# Patient Record
Sex: Female | Born: 1995 | Hispanic: No | Marital: Single | Smoking: Never smoker
Health system: Southern US, Community
[De-identification: ages and names within clinical notes are randomized; demographics above are authoritative.]

## PROBLEM LIST (undated history)

## (undated) DIAGNOSIS — N760 Acute vaginitis: Secondary | ICD-10-CM

## (undated) DIAGNOSIS — E669 Obesity, unspecified: Secondary | ICD-10-CM

## (undated) DIAGNOSIS — B9689 Other specified bacterial agents as the cause of diseases classified elsewhere: Secondary | ICD-10-CM

## (undated) DIAGNOSIS — F988 Other specified behavioral and emotional disorders with onset usually occurring in childhood and adolescence: Secondary | ICD-10-CM

## (undated) HISTORY — PX: NO PAST SURGERIES: SHX2092

---
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
## 2017-05-15 ENCOUNTER — Other Ambulatory Visit: Payer: Self-pay

## 2017-05-15 ENCOUNTER — Ambulatory Visit
Admission: EM | Admit: 2017-05-15 | Discharge: 2017-05-15 | Disposition: A | Discharge status: Home / Self Care | Payer: BLUE CROSS/BLUE SHIELD | Attending: Family Medicine | Admitting: Family Medicine

## 2017-05-15 ENCOUNTER — Encounter: Payer: Self-pay | Admitting: *Deleted

## 2017-05-15 DIAGNOSIS — B349 Viral infection, unspecified: Secondary | ICD-10-CM | POA: Diagnosis not present

## 2017-05-15 DIAGNOSIS — J069 Acute upper respiratory infection, unspecified: Secondary | ICD-10-CM

## 2017-05-15 LAB — RAPID STREP SCREEN (MED CTR MEBANE ONLY): Streptococcus, Group A Screen (Direct): NEGATIVE

## 2017-05-15 MED ORDER — FLUTICASONE PROPIONATE 50 MCG/ACT NA SUSP: 2.0000 | Freq: Every day | NASAL | 0 refills | Status: DC

## 2017-05-15 NOTE — ED Triage Notes (Signed)
PAtient started having symptoms of nasal congestion and sore throat  2 days ago.

## 2017-05-15 NOTE — ED Provider Notes (Signed)
[facility]   CSN: [id number] Arrival date & time: 05/15/17  1309  History   Chief Complaint Chief Complaint  Patient presents with  . Nasal Congestion  . Sore Throat   HPI  22 year old female presents with nasal congestion and sore throat.  Patient states she is been sick for 2 days.  She has had sore throat, and congestion.  Pain is 8/10 in severity. She states she had a fever last night.  No fever today.  No known exacerbating relieving factors.  No medications or interventions tried.  No other associated symptoms.  No other complaints or concerns at this time.  Past Medical History:  Diagnosis Date  . ADD (attention deficit disorder)    Patient Active Problem List   Diagnosis Date Noted  . Laceration of left index finger without damage to nail 08/25/2016  . Need for prophylactic vaccination with combined diphtheria-tetanus-pertussis (DTP) vaccine 08/25/2016   Past Surgical History:  Procedure Laterality Date  . NO PAST SURGERIES      OB History    No data available     Home Medications    Prior to Admission medications   Medication Sig Start Date End Date Taking? Authorizing Provider  fluticasone (FLONASE) 50 MCG/ACT nasal spray Place 2 sprays into both nostrils daily. 05/15/17   Tommie Sams, DO   Family History Family History  Problem Relation Age of Onset  . Healthy Mother    Social History Social History   Tobacco Use  . Smoking status: Never Smoker  . Smokeless tobacco: Never Used  Substance Use Topics  . Alcohol use: No  . Drug use: No     Allergies   Patient has no known allergies.   Review of Systems Review of Systems  Constitutional: Positive for fever.  HENT: Positive for congestion and sore throat.    Physical Exam Triage Vital Signs ED Triage Vitals  Enc Vitals Group     BP 05/15/17 1335 118/69     Pulse Rate 05/15/17 1335 (!) 110     Resp 05/15/17 1335 18     Temp 05/15/17 1335 98.4 F (36.9 C)     Temp Source  05/15/17 1335 Oral     SpO2 05/15/17 1335 98 %     Weight 05/15/17 1336 284 lb (128.8 kg)     Height 05/15/17 1336 5\' 6"  (1.676 m)     Head Circumference --      Peak Flow --      Pain Score 05/15/17 1335 8     Pain Loc --      Pain Edu? --      Excl. in GC? --    Updated Vital Signs BP 118/69 (BP Location: Left Arm)   Pulse (!) 110   Temp 98.4 F (36.9 C) (Oral)   Resp 18   Ht 5\' 6"  (1.676 m)   Wt 284 lb (128.8 kg)   LMP 04/24/2017   SpO2 98%   BMI 45.84 kg/m     Physical Exam  Constitutional: She is oriented to person, place, and time. She appears well-developed. No distress.  HENT:  Head: Normocephalic and atraumatic.  Oropharynx with erythema.  Cobblestoning noted.  Eyes: Conjunctivae are normal. Right eye exhibits no discharge. Left eye exhibits no discharge.  Neck: Neck supple.  Cardiovascular: Regular rhythm.  Tachycardia.  Pulmonary/Chest: Effort normal and breath sounds normal. She has no wheezes. She has no rales.  Lymphadenopathy:    She has no cervical adenopathy.  Neurological: She is alert and oriented to person, place, and time.  Nursing note and vitals reviewed.  UC Treatments / Results  Labs (all labs ordered are listed, but only abnormal results are displayed) Labs Reviewed  RAPID STREP SCREEN (NOT [facility])  CULTURE, GROUP A STREP [facility])    EKG  EKG Interpretation None       Radiology No results found.  Procedures Procedures (including critical care time)  Medications Ordered in UC Medications - No data to display   Initial Impression / Assessment and Plan / UC Course  I have reviewed the triage vital signs and the nursing notes.  Pertinent labs & imaging results that were available during my care of the patient were reviewed by me and considered in my medical decision making (see chart for details).     22 year old female presents with a viral upper respiratory infection.  No indication for antibiotics.  Treating with Flonase.   Supportive care.  Final Clinical Impressions(s) / UC Diagnoses   Final diagnoses:  Viral upper respiratory tract infection    ED Discharge Orders        Ordered    fluticasone (FLONASE) 50 MCG/ACT nasal spray  Daily     05/15/17 1409     Controlled Substance Prescriptions [place] Controlled Substance Registry consulted? Not Applicable   Tommie SamsCook, Reganne Messerschmidt G, DO 05/15/17 1447

## 2017-05-15 NOTE — Discharge Instructions (Signed)
Rest, fluids.  Flonase as directed.  Take care  Dr. Adriana Simasook

## 2017-05-16 ENCOUNTER — Telehealth: Payer: Self-pay | Admitting: Emergency Medicine

## 2017-05-16 LAB — CULTURE, GROUP A STREP (THRC)

## 2017-05-16 MED ORDER — AMOXICILLIN 500 MG PO CAPS: 500.0000 mg | ORAL_CAPSULE | Freq: Two times a day (BID) | ORAL | 0 refills | Status: AC

## 2017-05-16 NOTE — Telephone Encounter (Signed)
Rx sent in (see result note from throat culture done 05/15/17).

## 2017-11-09 ENCOUNTER — Ambulatory Visit
Admission: EM | Admit: 2017-11-09 | Discharge: 2017-11-09 | Disposition: A | Discharge status: Home / Self Care | Payer: BLUE CROSS/BLUE SHIELD | Attending: Family Medicine | Admitting: Family Medicine

## 2017-11-09 ENCOUNTER — Other Ambulatory Visit: Payer: Self-pay

## 2017-11-09 DIAGNOSIS — R509 Fever, unspecified: Secondary | ICD-10-CM | POA: Diagnosis not present

## 2017-11-09 DIAGNOSIS — J029 Acute pharyngitis, unspecified: Secondary | ICD-10-CM | POA: Diagnosis not present

## 2017-11-09 DIAGNOSIS — R52 Pain, unspecified: Secondary | ICD-10-CM

## 2017-11-09 DIAGNOSIS — M791 Myalgia, unspecified site: Secondary | ICD-10-CM

## 2017-11-09 DIAGNOSIS — R5383 Other fatigue: Secondary | ICD-10-CM

## 2017-11-09 LAB — RAPID STREP SCREEN (MED CTR MEBANE ONLY): Streptococcus, Group A Screen (Direct): NEGATIVE

## 2017-11-09 MED ORDER — AMOXICILLIN 500 MG PO CAPS: 500.0000 mg | ORAL_CAPSULE | Freq: Two times a day (BID) | ORAL | 0 refills | Status: DC

## 2017-11-09 MED ORDER — PREDNISONE 10 MG PO TABS: 20.0000 mg | ORAL_TABLET | Freq: Every day | ORAL | 0 refills | Status: DC

## 2017-11-09 NOTE — ED Triage Notes (Signed)
Pt with sore throat and headache starting on Friday. Pain 7/10

## 2017-11-09 NOTE — Discharge Instructions (Addendum)
Your rapid strep test is negative today. Strep culture sent to lab. Will treat for suspected strep throat while awaiting culture based on your presentation.Begin amoxicillin and prednisone (for tonsil swelling). Increase rest and fluids. Take Motrin for fever and body aches. Symptoms should begin to resolve with antibiotics over the next 48-72 hours. 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 F/u with PCP or our clinic if condition worsens or does not improve with treatment plan.

## 2017-11-09 NOTE — ED Provider Notes (Signed)
[facility]    CSN: [id number] Arrival date & time: 11/09/17  1537     History   Chief Complaint Chief Complaint  Patient presents with  . Sore Throat    HPI Kristen Charles is a 22 y.o. female.  Patient presents with severe sore throat x 2 days. Admits to fevers up to 101 degrees last night. Admits to associated body aches and fatigue. Admits painful swallowing and enlarged and tender lymph nodes of the anterior neck. Denies cough, congestion, ear pain. Patient has not taken any medication for symptoms. 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 Patient denies any further complaints or concerns today.  HPI  Past Medical History:  Diagnosis Date  . ADD (attention deficit disorder)     Patient Active Problem List   Diagnosis Date Noted  . Laceration of left index finger without damage to nail 08/25/2016  . Need for prophylactic vaccination with combined diphtheria-tetanus-pertussis (DTP) vaccine 08/25/2016    Past Surgical History:  Procedure Laterality Date  . NO PAST SURGERIES      OB History   None      Home Medications    Prior to Admission medications   Medication Sig Start Date End Date Taking? Authorizing Provider  fluticasone (FLONASE) 50 MCG/ACT nasal spray Place 2 sprays into both nostrils daily. 05/15/17   Tommie Samsook, Jayce G, DO    Family History Family History  Problem Relation Age of Onset  . Healthy Mother     Social History Social History   Tobacco Use  . Smoking status: Never Smoker  . Smokeless tobacco: Never Used  Substance Use Topics  . Alcohol use: No  . Drug use: No     Allergies   Patient has no known allergies.   Review of Systems Review of Systems  Constitutional: Positive for appetite change, chills, fatigue and fever.  HENT: Positive for sore throat, trouble swallowing and voice change. Negative for congestion, ear discharge, ear pain, rhinorrhea, sinus pressure and sinus pain.    Eyes: Negative for discharge and redness.  Respiratory: Negative for cough and shortness of breath.   Cardiovascular: Negative for chest pain.  Gastrointestinal: Positive for nausea. Negative for abdominal pain and vomiting.  Musculoskeletal: Positive for myalgias.  Skin: Negative for rash.  Neurological: Negative for dizziness and weakness.  Hematological: Positive for adenopathy (enlarged lymph nodes of neck).     Physical Exam Triage Vital Signs ED Triage Vitals  Enc Vitals Group     BP 11/09/17 1546 114/70     Pulse Rate 11/09/17 1546 96     Resp 11/09/17 1546 20     Temp 11/09/17 1546 98.1 F (36.7 C)     Temp Source 11/09/17 1546 Oral     SpO2 11/09/17 1546 100 %     Weight 11/09/17 1547 276 lb 2 oz (125.2 kg)     Height 11/09/17 1547 5\' 6"  (1.676 m)     Head Circumference --      Peak Flow --      Pain Score 11/09/17 1547 7     Pain Loc --      Pain Edu? --      Excl. in GC? --    No data found.  Updated Vital Signs BP 114/70 (BP Location: Left Arm)   Pulse 96   Temp 98.1 F (36.7 C) (Oral)   Resp 20   Ht 5\' 6"  (1.676 m)  Wt 276 lb 2 oz (125.2 kg)   LMP 10/14/2017   SpO2 100%   BMI 44.57 kg/m   Visual Acuity Right Eye Distance:   Left Eye Distance:   Bilateral Distance:    Right Eye Near:   Left Eye Near:    Bilateral Near:     Physical Exam  Constitutional: She is oriented to person, place, and time. She appears well-developed and well-nourished.  HENT:  Head: Normocephalic and atraumatic.  Right Ear: Hearing, tympanic membrane, external ear and ear canal normal.  Left Ear: Hearing, tympanic membrane, external ear and ear canal normal.  Nose: Nose normal.  Mouth/Throat: Uvula is midline and mucous membranes are normal. Oropharyngeal exudate and posterior oropharyngeal erythema present. Tonsils are 1+ on the right. Tonsils are 2+ on the left. Tonsillar exudate (left tonsil with whitish exudates).  Eyes: Pupils are equal, round, and reactive to  light. Conjunctivae and EOM are normal.  Neck: Neck supple.  Cardiovascular: Normal rate, regular rhythm and normal heart sounds.  No murmur heard. Pulmonary/Chest: Effort normal and breath sounds normal. No respiratory distress. She has no wheezes.  Lymphadenopathy:    She has cervical adenopathy (anterior L>R).  Neurological: She is alert and oriented to person, place, and time.  Skin: Skin is warm and dry. No rash noted. No erythema.  Psychiatric: She has a normal mood and affect. Her behavior is normal.     UC Treatments / Results  Labs (all labs ordered are listed, but only abnormal results are displayed) Labs Reviewed  RAPID STREP SCREEN ([facility] ONLY)    EKG None  Radiology No results found.  Procedures Procedures (including critical care time)  Medications Ordered in UC Medications - No data to display  Initial Impression / Assessment and Plan / UC Course  I have reviewed the triage vital signs and the nursing notes.  Pertinent labs & imaging results that were available during my care of the patient were reviewed by me and considered in my medical decision making (see chart for details).   Patient with severe sore throat and fever. On exam, enlarged left tonsil with whitish exudates. RST is negative. Strep culture sent to lab. Will treat for suspected strep throat while awaiting culture based on her presentation.Begin amoxicillin and prednisone (for tonsil swelling). Increase rest and fluids. Take Motrin for fever and body aches. Symptoms should begin to resolve with antibiotics over the next 48-72 hours. 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 F/u with PCP or our clinic if condition worsens or does not improve with treatment plan.  Final Clinical Impressions(s) / UC Diagnoses   Final diagnoses:  None   Discharge Instructions   None    ED Prescriptions    None     Controlled Substance  Prescriptions [place] Controlled Substance Registry consulted? Not Applicable   Gareth Morgan 11/09/17 1614

## 2017-11-11 ENCOUNTER — Ambulatory Visit (INDEPENDENT_AMBULATORY_CARE_PROVIDER_SITE_OTHER): Payer: BLUE CROSS/BLUE SHIELD

## 2017-11-11 ENCOUNTER — Other Ambulatory Visit: Payer: Self-pay

## 2017-11-11 ENCOUNTER — Ambulatory Visit
Admission: EM | Admit: 2017-11-11 | Discharge: 2017-11-11 | Disposition: A | Discharge status: Home / Self Care | Payer: BLUE CROSS/BLUE SHIELD | Attending: Family Medicine | Admitting: Family Medicine

## 2017-11-11 ENCOUNTER — Encounter: Payer: Self-pay | Admitting: Emergency Medicine

## 2017-11-11 DIAGNOSIS — M791 Myalgia, unspecified site: Secondary | ICD-10-CM

## 2017-11-11 DIAGNOSIS — R0789 Other chest pain: Secondary | ICD-10-CM

## 2017-11-11 DIAGNOSIS — M5489 Other dorsalgia: Secondary | ICD-10-CM | POA: Diagnosis not present

## 2017-11-11 MED ORDER — METAXALONE 800 MG PO TABS: 800.0000 mg | ORAL_TABLET | Freq: Three times a day (TID) | ORAL | 0 refills | Status: DC | PRN

## 2017-11-11 MED ORDER — MELOXICAM 15 MG PO TABS: 15.0000 mg | ORAL_TABLET | Freq: Every day | ORAL | 0 refills | Status: DC | PRN
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
## 2017-11-12 LAB — CULTURE, GROUP A STREP (THRC)

## 2017-12-03 ENCOUNTER — Other Ambulatory Visit: Payer: Self-pay

## 2017-12-03 ENCOUNTER — Ambulatory Visit
Admission: EM | Admit: 2017-12-03 | Discharge: 2017-12-03 | Disposition: A | Discharge status: Home / Self Care | Payer: BLUE CROSS/BLUE SHIELD | Attending: Family Medicine | Admitting: Family Medicine

## 2017-12-03 ENCOUNTER — Encounter: Payer: Self-pay | Admitting: Emergency Medicine

## 2017-12-03 DIAGNOSIS — R350 Frequency of micturition: Secondary | ICD-10-CM | POA: Diagnosis not present

## 2017-12-03 DIAGNOSIS — R300 Dysuria: Secondary | ICD-10-CM

## 2017-12-03 LAB — URINALYSIS, COMPLETE (UACMP) WITH MICROSCOPIC
Bilirubin Urine: NEGATIVE
GLUCOSE, UA: NEGATIVE mg/dL
HGB URINE DIPSTICK: NEGATIVE
Ketones, ur: NEGATIVE mg/dL
Nitrite: NEGATIVE
PROTEIN: NEGATIVE mg/dL
RBC / HPF: NONE SEEN RBC/hpf (ref 0–5)
Specific Gravity, Urine: 1.02 (ref 1.005–1.030)
pH: 7 (ref 5.0–8.0)

## 2017-12-03 MED ORDER — SULFAMETHOXAZOLE-TRIMETHOPRIM 800-160 MG PO TABS: 1.0000 | ORAL_TABLET | Freq: Two times a day (BID) | ORAL | 0 refills | Status: AC

## 2017-12-03 NOTE — Discharge Instructions (Addendum)
Take medication as prescribed. Rest. Drink plenty of fluids. Avoid triggers as discussed.   Follow up with your primary care physician this week as needed. Return to Urgent care for new or worsening concerns.

## 2017-12-03 NOTE — ED Provider Notes (Signed)
[facility] ____________________________________________  Time seen: Approximately 9:32 AM  I have reviewed the triage vital signs and the nursing notes.   HISTORY  Chief Complaint Urinary Frequency and Dysuria   HPI Kristen Charles is a 22 y.o. female presented for evaluation of 2 days of urinary frequency, urinary urgency, intermittent foul-smelling urine and intermittent suprapubic pressure sensation.  Denies abdominal pain otherwise.  Denies back pain.  No fevers.  Continues to eat and drink normally.  States that she does not drink enough water as she knows she is supposed to and does not always urinate post sexual activity, and reports this is a possible trigger.  Denies concerns of pregnancy or STDs.  Denies any atypical vaginal discharge, vaginal odor or concern of vaginal complaints.  States that this time feels well other than urinary frequency and urgency.  Also reports that her dad recently bought toilet paper that was scented and reports no longer using.  No alleviating measures attempted.  Denies other aggravating factors.  Reports otherwise feels well. Denies recent sickness. Denies recent antibiotic use.   Gardiner Rhyme, MD: PCP LMP: 2 weeks ago. Denies pregnancy.  Past Medical History:  Diagnosis Date  . ADD (attention deficit disorder)     Patient Active Problem List   Diagnosis Date Noted  . Laceration of left index finger without damage to nail 08/25/2016  . Need for prophylactic vaccination with combined diphtheria-tetanus-pertussis (DTP) vaccine 08/25/2016    Past Surgical History:  Procedure Laterality Date  . NO PAST SURGERIES       No current facility-administered medications for this encounter.   Current Outpatient Medications:  .  lisdexamfetamine (VYVANSE) 30 MG capsule, Take 30 mg by mouth daily., Disp: , Rfl:  .  sulfamethoxazole-trimethoprim (BACTRIM DS,SEPTRA DS) 800-160 MG tablet, Take 1 tablet by mouth 2 (two) times daily  for 3 days., Disp: 6 tablet, Rfl: 0  Allergies Patient has no known allergies.  Family History  Problem Relation Age of Onset  . Healthy Mother     Social History Social History   Tobacco Use  . Smoking status: Never Smoker  . Smokeless tobacco: Never Used  Substance Use Topics  . Alcohol use: No  . Drug use: No    Review of Systems Constitutional: No fever/chills Cardiovascular: Denies chest pain. Respiratory: Denies shortness of breath. Gastrointestinal:  No nausea, no vomiting.  No diarrhea.  No constipation. Genitourinary: positive for dysuria. Musculoskeletal: Negative for back pain. Skin: Negative for rash.   ____________________________________________   PHYSICAL EXAM:  VITAL SIGNS: ED Triage Vitals  Enc Vitals Group     BP 12/03/17 0910 (!) 137/93     Pulse Rate 12/03/17 0910 94     Resp 12/03/17 0910 18     Temp 12/03/17 0910 98.5 F (36.9 C)     Temp Source 12/03/17 0910 Oral     SpO2 12/03/17 0910 100 %     Weight 12/03/17 0911 276 lb 0.3 oz (125.2 kg)     Height 12/03/17 0911 5\' 6"  (1.676 m)     Head Circumference --      Peak Flow --      Pain Score 12/03/17 0911 5     Pain Loc --      Pain Edu? --      Excl. in GC? --     Constitutional: Alert and oriented. Well appearing and in no acute distress. ENT      Head: Normocephalic and atraumatic. Cardiovascular: Normal rate, regular  rhythm. Grossly normal heart sounds.  Good peripheral circulation. Respiratory: Normal respiratory effort without tachypnea nor retractions. Breath sounds are clear and equal bilaterally. No wheezes, rales, rhonchi. Gastrointestinal: Soft and nontender.No CVA tenderness. Musculoskeletal: No midline cervical, thoracic or lumbar tenderness to palpation.  Neurologic:  Normal speech and language. Speech is normal. No gait instability.  Skin:  Skin is warm, dry Psychiatric: Mood and affect are normal. Speech and behavior are normal. Patient exhibits appropriate insight  and judgment   ___________________________________________   LABS (all labs ordered are listed, but only abnormal results are displayed)  Labs Reviewed  URINALYSIS, COMPLETE (UACMP) WITH MICROSCOPIC - Abnormal; Notable for the following components:      Result Value   Leukocytes, UA TRACE (*)    Bacteria, UA RARE (*)    All other components within normal limits  URINE CULTURE    PROCEDURES Procedures   INITIAL IMPRESSION / ASSESSMENT AND PLAN / ED COURSE  Pertinent labs & imaging results that were available during my care of the patient were reviewed by me and considered in my medical decision making (see chart for details).  Well-appearing patient.  No acute distress.  Presenting for evaluation of dysuria for 2 days.  Denies atypical vaginal complaints.  Discussed multiple differentials with patient and reviewed urine, not clear UTI.  We will culture urine and empirically treat with oral Bactrim for 3 days.  Also counseled regarding irritating factors such as contact irritants such as the toilet paper.  Encouraged to urinate post coitus.  Encouraged rest, fluids, supportive care.  Discussed her follow-up and return parameters.Discussed indication, risks and benefits of medications with patient.  Discussed follow up with Primary care physician this week. Discussed follow up and return parameters including no resolution or any worsening concerns. Patient verbalized understanding and agreed to plan.   ____________________________________________   FINAL CLINICAL IMPRESSION(S) / ED DIAGNOSES  Final diagnoses:  Dysuria     ED Discharge Orders         Ordered    sulfamethoxazole-trimethoprim (BACTRIM DS,SEPTRA DS) 800-160 MG tablet  2 times daily     12/03/17 [id number]           Note: This dictation was prepared with Dragon dictation along with smaller phrase technology. Any transcriptional errors that result from this process are unintentional.         Renford DillsMiller, Helen Winterhalter,  NP 12/03/17 1022

## 2017-12-03 NOTE — ED Triage Notes (Signed)
Patient c/o urinary frequency and dysuria that started 2 days ago.  

## 2017-12-05 ENCOUNTER — Telehealth (HOSPITAL_COMMUNITY): Payer: Self-pay

## 2017-12-05 LAB — URINE CULTURE

## 2017-12-05 MED ORDER — FLUCONAZOLE 150 MG PO TABS: 150.0000 mg | ORAL_TABLET | Freq: Every day | ORAL | 0 refills | Status: AC

## 2017-12-05 NOTE — Telephone Encounter (Signed)
Urine culture positive for E.coli. this was treated with Bactrim. Patient called and made aware. Pt reports yeast infection symptoms. rx for diflucan sent to pharmacy

## 2018-04-02 ENCOUNTER — Ambulatory Visit
Admission: EM | Admit: 2018-04-02 | Discharge: 2018-04-02 | Disposition: A | Discharge status: Home / Self Care | Payer: BLUE CROSS/BLUE SHIELD | Attending: Family Medicine | Admitting: Family Medicine

## 2018-04-02 ENCOUNTER — Other Ambulatory Visit: Payer: Self-pay

## 2018-04-02 DIAGNOSIS — R6889 Other general symptoms and signs: Secondary | ICD-10-CM | POA: Insufficient documentation

## 2018-04-02 DIAGNOSIS — R05 Cough: Secondary | ICD-10-CM

## 2018-04-02 DIAGNOSIS — R0981 Nasal congestion: Secondary | ICD-10-CM | POA: Diagnosis not present

## 2018-04-02 HISTORY — DX: Obesity, unspecified: E66.9

## 2018-04-02 LAB — RAPID INFLUENZA A&B ANTIGENS (ARMC ONLY)
INFLUENZA A (ARMC): NEGATIVE
INFLUENZA B (ARMC): NEGATIVE

## 2018-04-02 MED ORDER — HYDROCOD POLST-CPM POLST ER 10-8 MG/5ML PO SUER: 5.0000 mL | Freq: Two times a day (BID) | ORAL | 0 refills | Status: DC

## 2018-04-02 MED ORDER — BENZONATATE 200 MG PO CAPS: ORAL_CAPSULE | ORAL | 0 refills | Status: DC

## 2018-04-02 NOTE — ED Provider Notes (Signed)
[facility]    CSN: [id number] Arrival date & time: 04/02/18  0941     History   Chief Complaint Chief Complaint  Patient presents with  . Headache  . Nasal Congestion  . Cough    HPI Kristen GOTTSHALL is a 23 y.o. female.   HPI  Female presents with a 4-day history of cough low-grade fever upset stomach and headache.  Did not have her flu shot this year.  Not had a fever.  Today  her temperature is  98.4 .  Pulse rate is 110.  Respirations are 20 with an O2 sat of 90% on room air.         Past Medical History:  Diagnosis Date  . ADD (attention deficit disorder)   . Obesity     Patient Active Problem List   Diagnosis Date Noted  . Laceration of left index finger without damage to nail 08/25/2016  . Need for prophylactic vaccination with combined diphtheria-tetanus-pertussis (DTP) vaccine 08/25/2016    Past Surgical History:  Procedure Laterality Date  . NO PAST SURGERIES      OB History   No obstetric history on file.      Home Medications    Prior to Admission medications   Medication Sig Start Date End Date Taking? Authorizing Provider  benzonatate (TESSALON) 200 MG capsule Take one cap TID PRN cough 04/02/18   Lutricia Feil, PA-C  chlorpheniramine-HYDROcodone [facility]) 10-8 MG/5ML SUER Take 5 mLs by mouth 2 (two) times daily. 04/02/18   Lutricia Feil, PA-C  lisdexamfetamine (VYVANSE) 30 MG capsule Take 30 mg by mouth daily.    [provider]    Family History Family History  Problem Relation Age of Onset  . Healthy Mother     Social History Social History   Tobacco Use  . Smoking status: Never Smoker  . Smokeless tobacco: Never Used  Substance Use Topics  . Alcohol use: No  . Drug use: No     Allergies   Patient has no known allergies.   Review of Systems Review of Systems  Constitutional: Positive for activity change. Negative for chills, fatigue and fever.  HENT: Positive for  congestion and postnasal drip.   Respiratory: Positive for cough.   All other systems reviewed and are negative.    Physical Exam Triage Vital Signs ED Triage Vitals  Enc Vitals Group     BP 04/02/18 0952 125/83     Pulse Rate 04/02/18 0952 (!) 110     Resp 04/02/18 0952 20     Temp 04/02/18 0952 98.4 F (36.9 C)     Temp Source 04/02/18 0952 Oral     SpO2 04/02/18 0952 98 %     Weight 04/02/18 0954 274 lb (124.3 kg)     Height 04/02/18 0954 5\' 6"  (1.676 m)     Head Circumference --      Peak Flow --      Pain Score 04/02/18 0954 7     Pain Loc --      Pain Edu? --      Excl. in GC? --    No data found.  Updated Vital Signs BP 125/83 (BP Location: Left Arm)   Pulse (!) 110   Temp 98.4 F (36.9 C) (Oral)   Resp 20   Ht 5\' 6"  (1.676 m)   Wt 274 lb (124.3 kg)   LMP 03/20/2018   SpO2 98%   BMI 44.22 kg/m  Visual Acuity Right Eye Distance:   Left Eye Distance:   Bilateral Distance:    Right Eye Near:   Left Eye Near:    Bilateral Near:     Physical Exam Vitals signs and nursing note reviewed.  Constitutional:      General: She is not in acute distress.    Appearance: She is well-developed. She is obese. She is not ill-appearing, toxic-appearing or diaphoretic.  HENT:     Head: Normocephalic.     Mouth/Throat:     Mouth: Mucous membranes are moist.  Neck:     Musculoskeletal: Normal range of motion and neck supple.  Pulmonary:     Effort: Pulmonary effort is normal.     Breath sounds: Normal breath sounds.  Musculoskeletal: Normal range of motion.  Lymphadenopathy:     Cervical: No cervical adenopathy.  Skin:    General: Skin is warm and dry.  Neurological:     Mental Status: She is alert.  Psychiatric:        Mood and Affect: Mood normal.        Behavior: Behavior normal.      UC Treatments / Results  Labs (all labs ordered are listed, but only abnormal results are displayed) Labs Reviewed  RAPID INFLUENZA A&B ANTIGENS ([facility] ONLY)     EKG None  Radiology No results found.  Procedures Procedures (including critical care time)  Medications Ordered in UC Medications - No data to display  Initial Impression / Assessment and Plan / UC Course  I have reviewed the triage vital signs and the nursing notes.  Pertinent labs & imaging results that were available during my care of the patient were reviewed by me and considered in my medical decision making (see chart for details).    Patient likely has flulike symptoms.  The symptoms however 4 days and does not 5 for Tamiflu at this time.  I have told her that she may just elect to write out the storm which she is agreeable to as well.  [place] cough suppressants for her.  He worsens she should follow-up with her primary care physician.  Also take plenty of fluids and rest as much as possible.  Tylenol Motrin for fever chills or body aches.   Final Clinical Impressions(s) / UC Diagnoses   Final diagnoses:  Flu-like symptoms     Discharge Instructions     Drink plenty of fluids.  Rest as much as possible.  Use Tylenol or Motrin for fever chills or body aches.   ED Prescriptions    Medication Sig Dispense Auth. Provider   benzonatate (TESSALON) 200 MG capsule Take one cap TID PRN cough 30 capsule Lutricia Feiloemer, Shabnam Ladd P, PA-C   chlorpheniramine-HYDROcodone (TUSSIONEX PENNKINETIC ER) 10-8 MG/5ML SUER Take 5 mLs by mouth 2 (two) times daily. 60 mL Lutricia Feiloemer, Gelila Well P, PA-C     Controlled Substance Prescriptions [place] Controlled Substance Registry consulted? Not Applicable   Lutricia FeilRoemer, Mearl Harewood P, PA-C 04/02/18 2049

## 2018-04-02 NOTE — ED Triage Notes (Signed)
4 days of cough, low grade fever, stomach upset, and headache

## 2018-04-02 NOTE — Discharge Instructions (Addendum)
Drink plenty of fluids.  Rest as much as possible.  Use Tylenol or Motrin for fever chills or body aches.  °

## 2018-05-12 ENCOUNTER — Ambulatory Visit
Admission: EM | Admit: 2018-05-12 | Discharge: 2018-05-12 | Disposition: A | Discharge status: Home / Self Care | Payer: BLUE CROSS/BLUE SHIELD | Attending: Family Medicine | Admitting: Family Medicine

## 2018-05-12 ENCOUNTER — Other Ambulatory Visit: Payer: Self-pay

## 2018-05-12 DIAGNOSIS — B9689 Other specified bacterial agents as the cause of diseases classified elsewhere: Secondary | ICD-10-CM

## 2018-05-12 DIAGNOSIS — N760 Acute vaginitis: Secondary | ICD-10-CM | POA: Diagnosis not present

## 2018-05-12 LAB — URINALYSIS, COMPLETE (UACMP) WITH MICROSCOPIC
BACTERIA UA: NONE SEEN
Bilirubin Urine: NEGATIVE
GLUCOSE, UA: NEGATIVE mg/dL
Hgb urine dipstick: NEGATIVE
Ketones, ur: NEGATIVE mg/dL
LEUKOCYTE UA: NEGATIVE
Nitrite: NEGATIVE
Protein, ur: NEGATIVE mg/dL
RBC / HPF: NONE SEEN RBC/hpf (ref 0–5)
SPECIFIC GRAVITY, URINE: 1.02 (ref 1.005–1.030)
WBC, UA: NONE SEEN WBC/hpf (ref 0–5)
pH: 8.5 — ABNORMAL HIGH (ref 5.0–8.0)

## 2018-05-12 LAB — WET PREP, GENITAL
Sperm: NONE SEEN
Trich, Wet Prep: NONE SEEN
Yeast Wet Prep HPF POC: NONE SEEN

## 2018-05-12 MED ORDER — METRONIDAZOLE 500 MG PO TABS: 500.0000 mg | ORAL_TABLET | Freq: Two times a day (BID) | ORAL | 0 refills | Status: DC

## 2018-05-12 NOTE — ED Provider Notes (Signed)
[facility]    CSN: [id number] Arrival date & time: 05/12/18  1215     History   Chief Complaint Chief Complaint  Patient presents with  . Dysuria    APPT    HPI Kristen Charles is a 23 y.o. female.   HPI  23 year old female present painful urination that started about a week ago.  Is had urgency but no frequency.  No abnormal vaginal discharge but has noticed itching redness near the urethra.  A monogamous relationship for 5 years and is not concerned regarding sexually transmitted diseases.  Had no fever or chills.  Had no nausea or vomiting.        Past Medical History:  Diagnosis Date  . ADD (attention deficit disorder)   . Obesity     Patient Active Problem List   Diagnosis Date Noted  . Laceration of left index finger without damage to nail 08/25/2016  . Need for prophylactic vaccination with combined diphtheria-tetanus-pertussis (DTP) vaccine 08/25/2016    Past Surgical History:  Procedure Laterality Date  . NO PAST SURGERIES      OB History   No obstetric history on file.      Home Medications    Prior to Admission medications   Medication Sig Start Date End Date Taking? Authorizing Provider  metroNIDAZOLE (FLAGYL) 500 MG tablet Take 1 tablet (500 mg total) by mouth 2 (two) times daily. 05/12/18   Lutricia Feil, PA-C    Family History Family History  Problem Relation Age of Onset  . Healthy Mother     Social History Social History   Tobacco Use  . Smoking status: Never Smoker  . Smokeless tobacco: Never Used  Substance Use Topics  . Alcohol use: No  . Drug use: No     Allergies   Patient has no known allergies.   Review of Systems Review of Systems  Constitutional: Positive for activity change. Negative for appetite change, chills, fatigue and fever.  Genitourinary: Positive for dysuria and urgency. Negative for frequency, genital sores and vaginal discharge.  All other systems reviewed and are  negative.    Physical Exam Triage Vital Signs ED Triage Vitals  Enc Vitals Group     BP 05/12/18 1249 118/78     Pulse Rate 05/12/18 1249 88     Resp 05/12/18 1249 18     Temp 05/12/18 1249 97.6 F (36.4 C)     Temp Source 05/12/18 1249 Oral     SpO2 05/12/18 1249 100 %     Weight 05/12/18 1247 281 lb (127.5 kg)     Height 05/12/18 1247 5\' 6"  (1.676 m)     Head Circumference --      Peak Flow --      Pain Score 05/12/18 1247 7     Pain Loc --      Pain Edu? --      Excl. in GC? --    No data found.  Updated Vital Signs BP 118/78 (BP Location: Left Arm)   Pulse 88   Temp 97.6 F (36.4 C) (Oral)   Resp 18   Ht 5\' 6"  (1.676 m)   Wt 281 lb (127.5 kg)   LMP 04/21/2018   SpO2 100%   BMI 45.35 kg/m   Visual Acuity Right Eye Distance:   Left Eye Distance:   Bilateral Distance:    Right Eye Near:   Left Eye Near:    Bilateral Near:     Physical Exam  Vitals signs and nursing note reviewed.  Constitutional:      General: She is not in acute distress.    Appearance: Normal appearance. She is normal weight. She is not ill-appearing, toxic-appearing or diaphoretic.  HENT:     Head: Normocephalic.     Nose: Nose normal.     Mouth/Throat:     Mouth: Mucous membranes are moist.     Pharynx: No oropharyngeal exudate or posterior oropharyngeal erythema.  Eyes:     General:        Right eye: No discharge.        Left eye: No discharge.     Conjunctiva/sclera: Conjunctivae normal.  Neck:     Musculoskeletal: Normal range of motion and neck supple.  Musculoskeletal: Normal range of motion.  Skin:    General: Skin is warm and dry.  Neurological:     General: No focal deficit present.     Mental Status: She is alert and oriented to person, place, and time.  Psychiatric:        Mood and Affect: Mood normal.        Behavior: Behavior normal.        Thought Content: Thought content normal.        Judgment: Judgment normal.      UC Treatments / Results   Labs (all labs ordered are listed, but only abnormal results are displayed) Labs Reviewed  WET PREP, GENITAL - Abnormal; Notable for the following components:      Result Value   Clue Cells Wet Prep HPF POC PRESENT (*)    WBC, Wet Prep HPF POC FEW (*)    All other components within normal limits  URINALYSIS, COMPLETE (UACMP) WITH MICROSCOPIC - Abnormal; Notable for the following components:   pH 8.5 (*)    All other components within normal limits    EKG None  Radiology No results found.  Procedures Procedures (including critical care time)  Medications Ordered in UC Medications - No data to display  Initial Impression / Assessment and Plan / UC Course  I have reviewed the triage vital signs and the nursing notes.  Pertinent labs & imaging results that were available during my care of the patient were reviewed by me and considered in my medical decision making (see chart for details).   I reviewed the laboratories with the patient.  She has BV.  Has chosen to take the Flagyl p.o.  Refrain from any alcoholic beverages and sex for the next 7 days.   Final Clinical Impressions(s) / UC Diagnoses   Final diagnoses:  BV (bacterial vaginosis)   Discharge Instructions   None    ED Prescriptions    Medication Sig Dispense Auth. Provider   metroNIDAZOLE (FLAGYL) 500 MG tablet Take 1 tablet (500 mg total) by mouth 2 (two) times daily. 14 tablet Lutricia Feil, PA-C     Controlled Substance Prescriptions  Controlled Substance Registry consulted? Not Applicable   Lutricia Feil, PA-C 05/12/18 1425

## 2018-05-12 NOTE — ED Triage Notes (Signed)
Patient complains of painful urination that started 1 week ago. Patient denies frequency but has noticed urgency.

## 2018-05-24 ENCOUNTER — Encounter: Payer: Self-pay | Admitting: Gynecology

## 2018-05-24 ENCOUNTER — Ambulatory Visit
Admission: EM | Admit: 2018-05-24 | Discharge: 2018-05-24 | Disposition: A | Discharge status: Home / Self Care | Payer: BLUE CROSS/BLUE SHIELD | Attending: Emergency Medicine | Admitting: Emergency Medicine

## 2018-05-24 ENCOUNTER — Other Ambulatory Visit: Payer: Self-pay

## 2018-05-24 DIAGNOSIS — J028 Acute pharyngitis due to other specified organisms: Secondary | ICD-10-CM | POA: Diagnosis not present

## 2018-05-24 DIAGNOSIS — B9789 Other viral agents as the cause of diseases classified elsewhere: Secondary | ICD-10-CM

## 2018-05-24 LAB — RAPID STREP SCREEN (MED CTR MEBANE ONLY): Streptococcus, Group A Screen (Direct): NEGATIVE

## 2018-05-24 NOTE — ED Provider Notes (Signed)
[facility]    CSN: [id number] Arrival date & time: 05/24/18  1245     History   Chief Complaint No chief complaint on file.   HPI Kristen Charles is a 23 y.o. female.   HPI - female well-known to our practice presents with a sore throat and fever that started yesterday.  She states she had a fever of 101 last night that was taken axillary.  Today her vital signs are normal.  States that she does not have a cough but does feel feverish and achy.     Past Medical History:  Diagnosis Date  . ADD (attention deficit disorder)   . Obesity     Patient Active Problem List   Diagnosis Date Noted  . Laceration of left index finger without damage to nail 08/25/2016  . Need for prophylactic vaccination with combined diphtheria-tetanus-pertussis (DTP) vaccine 08/25/2016    Past Surgical History:  Procedure Laterality Date  . NO PAST SURGERIES      OB History   No obstetric history on file.      Home Medications    Prior to Admission medications   Not on File    Family History Family History  Problem Relation Age of Onset  . Healthy Mother     Social History Social History   Tobacco Use  . Smoking status: Never Smoker  . Smokeless tobacco: Never Used  Substance Use Topics  . Alcohol use: No  . Drug use: No     Allergies   Patient has no known allergies.   Review of Systems Review of Systems  Constitutional: Positive for activity change, chills, fatigue and fever.  HENT: Positive for congestion and sore throat.   All other systems reviewed and are negative.    Physical Exam Triage Vital Signs ED Triage Vitals  Enc Vitals Group     BP 05/24/18 1316 124/80     Pulse Rate 05/24/18 1316 93     Resp 05/24/18 1316 18     Temp 05/24/18 1316 97.7 F (36.5 C)     Temp Source 05/24/18 1316 Oral     SpO2 05/24/18 1316 100 %     Weight 05/24/18 1317 281 lb (127.5 kg)     Height --      Head Circumference --      Peak Flow --      Pain  Score 05/24/18 1316 7     Pain Loc --      Pain Edu? --      Excl. in GC? --    No data found.  Updated Vital Signs BP 124/80 (BP Location: Left Arm)   Pulse 93   Temp 97.7 F (36.5 C) (Oral)   Resp 18   Wt 281 lb (127.5 kg)   LMP 04/25/2018   SpO2 100%   BMI 45.35 kg/m   Visual Acuity Right Eye Distance:   Left Eye Distance:   Bilateral Distance:    Right Eye Near:   Left Eye Near:    Bilateral Near:     Physical Exam Vitals signs and nursing note reviewed.  Constitutional:      General: She is not in acute distress.    Appearance: Normal appearance. She is obese. She is not ill-appearing, toxic-appearing or diaphoretic.  HENT:     Head: Normocephalic and atraumatic.     Right Ear: Tympanic membrane, ear canal and external ear normal.     Left Ear: Tympanic membrane, ear canal  and external ear normal.     Nose: Rhinorrhea present. No congestion.     Mouth/Throat:     Mouth: Mucous membranes are moist.     Pharynx: Oropharynx is clear. No oropharyngeal exudate or posterior oropharyngeal erythema.  Eyes:     General:        Right eye: No discharge.        Left eye: No discharge.     Conjunctiva/sclera: Conjunctivae normal.  Neck:     Musculoskeletal: Normal range of motion and neck supple.  Pulmonary:     Effort: Pulmonary effort is normal.     Breath sounds: Normal breath sounds.  Musculoskeletal: Normal range of motion.  Lymphadenopathy:     Cervical: No cervical adenopathy.  Skin:    General: Skin is warm and dry.  Neurological:     General: No focal deficit present.     Mental Status: She is alert and oriented to person, place, and time.  Psychiatric:        Mood and Affect: Mood normal.        Behavior: Behavior normal.        Thought Content: Thought content normal.        Judgment: Judgment normal.      UC Treatments / Results  Labs (all labs ordered are listed, but only abnormal results are displayed) Labs Reviewed  RAPID STREP SCREEN  ([facility] ONLY)  CULTURE, GROUP A STREP [facility])    EKG None  Radiology No results found.  Procedures Procedures (including critical care time)  Medications Ordered in UC Medications - No data to display  Initial Impression / Assessment and Plan / UC Course  I have reviewed the triage vital signs and the nursing notes.  Pertinent labs & imaging results that were available during my care of the patient were reviewed by me and considered in my medical decision making (see chart for details).   Reviewed the results of the strep test which was negative today.  Culture and should be available in 2 days.  In the meantime is likely that she has a viral pharyngitis.  She will use salt water gargles as necessary for comfort and ibuprofen for throat pain.   Final Clinical Impressions(s) / UC Diagnoses   Final diagnoses:  Sore throat (viral)   Discharge Instructions   None    ED Prescriptions    None     Controlled Substance Prescriptions Rule Controlled Substance Registry consulted? Not Applicable   Lutricia Feil, PA-C 05/24/18 1825

## 2018-05-24 NOTE — ED Triage Notes (Signed)
Patient c/o sore throat and fever x yesterday of 101.

## 2018-05-27 LAB — CULTURE, GROUP A STREP (THRC)

## 2019-01-07 ENCOUNTER — Other Ambulatory Visit: Payer: Self-pay

## 2019-01-07 ENCOUNTER — Encounter: Payer: Self-pay | Admitting: Emergency Medicine

## 2019-01-07 ENCOUNTER — Ambulatory Visit
Admission: EM | Admit: 2019-01-07 | Discharge: 2019-01-07 | Disposition: A | Discharge status: Home / Self Care | Payer: BLUE CROSS/BLUE SHIELD | Attending: Family Medicine | Admitting: Family Medicine

## 2019-01-07 DIAGNOSIS — M545 Low back pain: Secondary | ICD-10-CM | POA: Diagnosis not present

## 2019-01-07 DIAGNOSIS — G8929 Other chronic pain: Secondary | ICD-10-CM | POA: Diagnosis not present

## 2019-01-07 MED ORDER — MELOXICAM 15 MG PO TABS: 15.0000 mg | ORAL_TABLET | Freq: Every day | ORAL | 0 refills | Status: DC | PRN

## 2019-01-07 MED ORDER — TIZANIDINE HCL 4 MG PO TABS: 4.0000 mg | ORAL_TABLET | Freq: Three times a day (TID) | ORAL | 0 refills | Status: DC | PRN

## 2019-01-07 NOTE — ED Triage Notes (Signed)
Patient c/o low back pain x 2 years. She states it started when her 5lb dog would walk across her back. 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 Patient had seen her PCP for this before and she was sent to PT but due to [place] she never started therapy.

## 2019-01-07 NOTE — ED Provider Notes (Signed)
[facility]    CSN: [id number] Arrival date & time: 01/07/19  1316  History   Chief Complaint Chief Complaint  Patient presents with  . Back Pain   HPI  23 year old female presents with low back pain.  Patient states that this is a chronic issue.  She saw her primary care physician for this earlier in the year and was referred to physical therapy.  She has not done physical therapy due to the Covid pandemic.  Patient reports that her back pain worsened as of 2 days ago.  She believes it is started after she lifted some boxes at work.  She localizes the pain to the low back.  It is diffuse and spans the entire lower back.  Worse with certain activities.  She has taken Advil without resolution.  She rates her pain as 9/10 in severity.  She appears very comfortable and does not appear to be in severe pain.  No other associated symptoms.  No other complaints.  Past Medical History:  Diagnosis Date  . ADD (attention deficit disorder)   . Obesity    Patient Active Problem List   Diagnosis Date Noted  . Laceration of left index finger without damage to nail 08/25/2016  . Need for prophylactic vaccination with combined diphtheria-tetanus-pertussis (DTP) vaccine 08/25/2016   Past Surgical History:  Procedure Laterality Date  . NO PAST SURGERIES     OB History   No obstetric history on file.    Home Medications    Prior to Admission medications   Medication Sig Start Date End Date Taking? Authorizing Provider  meloxicam (MOBIC) 15 MG tablet Take 1 tablet (15 mg total) by mouth daily as needed. 01/07/19   Coral Spikes, DO  tiZANidine (ZANAFLEX) 4 MG tablet Take 1 tablet (4 mg total) by mouth every 8 (eight) hours as needed for muscle spasms. 01/07/19   Coral Spikes, DO    Family History Family History  Problem Relation Age of Onset  . Healthy Mother     Social History Social History   Tobacco Use  . Smoking status: Never Smoker  . Smokeless tobacco: Never  Used  Substance Use Topics  . Alcohol use: No  . Drug use: No     Allergies   Patient has no known allergies.   Review of Systems Review of Systems  Constitutional: Negative.   Musculoskeletal: Positive for back pain.   Physical Exam Triage Vital Signs ED Triage Vitals  Enc Vitals Group     BP 01/07/19 1328 119/61     Pulse --      Resp 01/07/19 1328 18     Temp 01/07/19 1328 98.4 F (36.9 C)     Temp Source 01/07/19 1328 Oral     SpO2 --      Weight 01/07/19 1326 270 lb (122.5 kg)     Height 01/07/19 1326 5\' 6"  (1.676 m)     Head Circumference --      Peak Flow --      Pain Score 01/07/19 1326 9     Pain Loc --      Pain Edu? --      Excl. in [place]? --    Updated Vital Signs BP 119/61 (BP Location: Right Arm)   Temp 98.4 F (36.9 C) (Oral)   Resp 18   Ht 5\' 6"  (1.676 m)   Wt 122.5 kg   LMP 11/29/2018   BMI 43.58 kg/m   Visual Acuity Right Eye  Distance:   Left Eye Distance:   Bilateral Distance:    Right Eye Near:   Left Eye Near:    Bilateral Near:     Physical Exam Vitals signs and nursing note reviewed.  Constitutional:      General: She is not in acute distress.    Appearance: Normal appearance. She is obese. She is not ill-appearing.  Eyes:     General:        Right eye: No discharge.        Left eye: No discharge.     Conjunctiva/sclera: Conjunctivae normal.  Cardiovascular:     Rate and Rhythm: Normal rate and regular rhythm.  Pulmonary:     Effort: Pulmonary effort is normal.     Breath sounds: Normal breath sounds. No wheezing, rhonchi or rales.  Musculoskeletal:     Comments: Patient with exquisite tenderness to palpation of the lumbar spine bilaterally with minimal force applied.  Neurological:     Mental Status: She is alert.  Psychiatric:        Mood and Affect: Mood normal.        Behavior: Behavior normal.    UC Treatments / Results  Labs (all labs ordered are listed, but only abnormal results are displayed) Labs Reviewed  - No data to display  EKG   Radiology No results found.  Procedures Procedures (including critical care time)  Medications Ordered in UC Medications - No data to display  Initial Impression / Assessment and Plan / UC Course  I have reviewed the triage vital signs and the nursing notes.  Pertinent labs & imaging results that were available during my care of the patient were reviewed by me and considered in my medical decision making (see chart for details).    23 year old female presents with acute on chronic low back pain.  Treating with meloxicam and Zanaflex.  Supportive care.  Final Clinical Impressions(s) / UC Diagnoses   Final diagnoses:  Chronic bilateral low back pain without sciatica     Discharge Instructions     Rest.  Medication as prescribed.  Follow up with PCP regarding physical therapy.   Take care  Dr. Adriana Simas    ED Prescriptions    Medication Sig Dispense Auth. Provider   meloxicam (MOBIC) 15 MG tablet Take 1 tablet (15 mg total) by mouth daily as needed. 30 tablet Samaad Hashem G, DO   tiZANidine (ZANAFLEX) 4 MG tablet Take 1 tablet (4 mg total) by mouth every 8 (eight) hours as needed for muscle spasms. 30 tablet Tommie Sams, DO     PDMP not reviewed this encounter.   Tommie Sams, [place] 01/07/19 1501

## 2019-01-07 NOTE — Discharge Instructions (Signed)
Rest.  Medication as prescribed.  Follow up with PCP regarding physical therapy.   Take care  Dr. Lacinda Axon

## 2019-01-28 ENCOUNTER — Other Ambulatory Visit: Payer: Self-pay

## 2019-01-28 ENCOUNTER — Ambulatory Visit
Admission: EM | Admit: 2019-01-28 | Discharge: 2019-01-28 | Disposition: A | Discharge status: Home / Self Care | Payer: BC Managed Care – PPO | Attending: Urgent Care | Admitting: Urgent Care

## 2019-01-28 DIAGNOSIS — N760 Acute vaginitis: Secondary | ICD-10-CM | POA: Diagnosis not present

## 2019-01-28 DIAGNOSIS — Z3202 Encounter for pregnancy test, result negative: Secondary | ICD-10-CM | POA: Diagnosis not present

## 2019-01-28 DIAGNOSIS — N898 Other specified noninflammatory disorders of vagina: Secondary | ICD-10-CM

## 2019-01-28 DIAGNOSIS — B9689 Other specified bacterial agents as the cause of diseases classified elsewhere: Secondary | ICD-10-CM

## 2019-01-28 LAB — URINALYSIS, COMPLETE (UACMP) WITH MICROSCOPIC
Bilirubin Urine: NEGATIVE
Glucose, UA: NEGATIVE mg/dL
Hgb urine dipstick: NEGATIVE
Ketones, ur: NEGATIVE mg/dL
Leukocytes,Ua: NEGATIVE
Nitrite: NEGATIVE
Protein, ur: NEGATIVE mg/dL
Specific Gravity, Urine: 1.025 (ref 1.005–1.030)
pH: 7.5 (ref 5.0–8.0)

## 2019-01-28 LAB — PREGNANCY, URINE: Preg Test, Ur: NEGATIVE

## 2019-01-28 LAB — WET PREP, GENITAL
Sperm: NONE SEEN
Trich, Wet Prep: NONE SEEN
Yeast Wet Prep HPF POC: NONE SEEN

## 2019-01-28 MED ORDER — CLOTRIMAZOLE 1 % VA CREA: 1.0000 | TOPICAL_CREAM | Freq: Every day | VAGINAL | 0 refills | Status: DC

## 2019-01-28 MED ORDER — METRONIDAZOLE 500 MG PO TABS: 500.0000 mg | ORAL_TABLET | Freq: Two times a day (BID) | ORAL | 0 refills | Status: DC

## 2019-01-28 NOTE — ED Provider Notes (Signed)
[place], [place]   Name: Kristen Charles DOB: 1995/07/31 MRN: [id number] CSN: [id number] PCP: Cathie Hoops, PA  Arrival date and time:  01/28/19 1549  Chief Complaint:  Vaginal Discharge, Vaginal Itching, and Cloudy urine   NOTE: Prior to seeing the patient today, I have reviewed the triage nursing documentation and vital signs. Clinical staff has updated patient's PMH/PSHx, current medication list, and drug allergies/intolerances to ensure comprehensive history available to assist in medical decision making.   History:   HPI: Kristen Charles is a 23 y.o. female who presents today with complaints of urinary symptoms that began with acute onset 3 days ago. She complains of dysuria, frequency, and urgency. Her urine is reported as being cloudy with no appreciated gross hematuria. She has not noticed her urine being malodorous. Patient denies any associated nausea, vomiting, fever, and chills. She has not experienced any pain in her lower back, flank area, or abdomen. Patient advises that she does not have a past medical history that is significant for recurrent urinary tract infections. Patient endorses that she engages in unprotected sexual activity. She notes that sexual activity is in the context of a committed monogamous relationship with a single female partner. She denies any vaginal pain or bleeding. She does however report significant external itching and white "milky" discharge. Patient's last menstrual period was 12/29/2018. There are no concerns that she is currently pregnant.   Past Medical History:  Diagnosis Date  . ADD (attention deficit disorder)   . Obesity     Past Surgical History:  Procedure Laterality Date  . NO PAST SURGERIES      Family History  Problem Relation Age of Onset  . Healthy Mother     Social History   Tobacco Use  . Smoking status: Never Smoker  . Smokeless tobacco: Never Used  Substance Use Topics  . Alcohol use: No  . Drug use: No     Patient Active Problem List   Diagnosis Date Noted  . Laceration of left index finger without damage to nail 08/25/2016  . Need for prophylactic vaccination with combined diphtheria-tetanus-pertussis (DTP) vaccine 08/25/2016    Home Medications:    No outpatient medications have been marked as taking for the 01/28/19 encounter [facility] Encounter).    Allergies:   Patient has no known allergies.  Review of Systems (ROS): Review of Systems  Constitutional: Negative for chills and fever.  Respiratory: Negative for cough and shortness of breath.   Cardiovascular: Negative for chest pain and palpitations.  Gastrointestinal: Negative for abdominal pain, nausea and vomiting.  Genitourinary: Positive for dysuria, frequency, urgency and vaginal discharge. Negative for decreased urine volume, hematuria, pelvic pain, vaginal bleeding and vaginal pain.  Musculoskeletal: Negative for back pain.  Skin: Negative for color change, pallor and rash.  Neurological: Negative for dizziness, syncope, weakness and headaches.  All other systems reviewed and are negative.    Vital Signs: Today's Vitals   01/28/19 1557 01/28/19 1558 01/28/19 1559 01/28/19 1655  BP:   118/79   Pulse:   84   Resp:   18   Temp:   98.3 F (36.8 C)   TempSrc:   Oral   SpO2:   100%   Weight:  274 lb (124.3 kg)    PainSc: 2    2     Physical Exam: Physical Exam  Constitutional: She is oriented to person, place, and time and well-developed, well-nourished, and in no distress. No distress.  HENT:  Head: Normocephalic and  atraumatic.  Nose: Nose normal.  Mouth/Throat: Uvula is midline, oropharynx is clear and moist and mucous membranes are normal.  Eyes: Pupils are equal, round, and reactive to light. Conjunctivae and EOM are normal.  Neck: Normal range of motion. Neck supple.  Cardiovascular: Normal rate, regular rhythm, normal heart sounds and intact distal pulses.  Pulmonary/Chest: Effort normal and breath  sounds normal. No respiratory distress.  Abdominal: Soft. Normal appearance and bowel sounds are normal. She exhibits no distension. There is abdominal tenderness (pressure) in the suprapubic area. There is no CVA tenderness.  Genitourinary:    Genitourinary Comments: Exam deferred. No vaginal/pelvic pain or bleeding. Patient is not currently pregnant. She has elected to self collect specimen swab for wet prep.   Musculoskeletal: Normal range of motion.  Neurological: She is alert and oriented to person, place, and time. Gait normal.  Skin: Skin is warm and dry. No rash noted. She is not diaphoretic.  Psychiatric: Mood, memory, affect and judgment normal.  Nursing note and vitals reviewed.   Urgent Care Treatments / Results:   LABS: PLEASE NOTE: all labs that were ordered this encounter are listed, however only abnormal results are displayed. Labs Reviewed  WET PREP, GENITAL - Abnormal; Notable for the following components:      Result Value   Clue Cells Wet Prep HPF POC PRESENT (*)    WBC, Wet Prep HPF POC FEW (*)    All other components within normal limits  URINALYSIS, COMPLETE (UACMP) WITH MICROSCOPIC - Abnormal; Notable for the following components:   Bacteria, UA RARE (*)    All other components within normal limits  PREGNANCY, URINE    EKG: -None  RADIOLOGY: No results found.  PROCEDURES: Procedures  MEDICATIONS RECEIVED THIS VISIT: Medications - No data to display  PERTINENT CLINICAL COURSE NOTES/UPDATES:   Initial Impression / Assessment and Plan / Urgent Care Course:  Pertinent labs & imaging results that were available during my care of the patient were personally reviewed by me and considered in my medical decision making (see lab/imaging section of note for values and interpretations).  Kristen Charles is a 23 y.o. female who presents to [facility] today with complaints of Vaginal Discharge, Vaginal Itching, and Cloudy urine   Patient is well  appearing overall in clinic today. She does not appear to be in any acute distress. Presenting symptoms (see HPI) and exam as documented above. UA (-) for infection. Urine HCG (-) for pregnancy. Wet prep (+) for clue cells, which is consistent with bacterial vaginosis (BV) infection. Will treat with a 7 day course of oral metronidazole. Patient encouraged to complete the entire course of antibiotics even if she begins to feel better. Educated on need to avoid all ETOH while she is taking this medication in order to prevent a disulfiram like reaction that will result in significant nausea and vomiting. Patient encouraged to increase her fluid intake as much as possible. Discussed that water is always best to flush the urinary tract and prevent development of a urinary tract infection while on treatment for the BV. Will also send in clotrimazole for use at bedtime to help with vaginal itching.   Discussed follow up with primary care physician in 1 week for re-evaluation. I have reviewed the follow up and strict return precautions for any new or worsening symptoms. Patient is aware of symptoms that would be deemed urgent/emergent, and would thus require further evaluation either here or in the emergency department. At the time of  discharge, she verbalized understanding and consent with the discharge plan as it was reviewed with her. All questions were fielded by provider and/or clinic staff prior to patient discharge.    Final Clinical Impressions / Urgent Care Diagnoses:   Final diagnoses:  BV (bacterial vaginosis)  Vaginal discharge  Vaginal itching    New Prescriptions:  [place] Controlled Substance Registry consulted? Not Applicable  Meds ordered this encounter  Medications  . metroNIDAZOLE (FLAGYL) 500 MG tablet    Sig: Take 1 tablet (500 mg total) by mouth 2 (two) times daily.    Dispense:  14 tablet    Refill:  0  . clotrimazole (GYNE-LOTRIMIN) 1 % vaginal cream    Sig: Place 1 Applicatorful  vaginally at bedtime.    Dispense:  45 g    Refill:  0    Recommended Follow up Care:  Patient encouraged to follow up with the following provider within the specified time frame, or sooner as dictated by the severity of her symptoms. As always, she was instructed that for any urgent/emergent care needs, she should seek care either here or in the emergency department for more immediate evaluation.  Follow-up Information    Cathie HoopsOwens, Leanne Whaley, PA In 1 week.   Specialty: Family Medicine Why: General reassessment of symptoms if not improving Contact information: [address] [place] [address] [phone]         NOTE: This note was prepared using Dragon dictation software along with smaller phrase technology. Despite my best ability to proofread, there is the potential that transcriptional errors may still occur from this process, and are completely unintentional.    Verlee MonteGray, Carman Auxier E, NP 01/29/19 2335

## 2019-01-28 NOTE — ED Triage Notes (Signed)
Pt states she has a vaginal itch and swelling x 3 days. Pt states she has vaginal discharge and her urine is cloudy. Pt states she has pressure while voiding.

## 2019-01-28 NOTE — Discharge Instructions (Signed)
It was very nice seeing you today in clinic. Thank you for entrusting me with your care.   Please utilize the medications that we discussed. Your prescriptions has been called in to your pharmacy. AVOID ALL ALCOHOL while taking the antibiotic... it will make you very sick.   Make arrangements to follow up with your regular doctor in 1 week for re-evaluation if not improving.  If your symptoms/condition worsens, please seek follow up care either here or in the ER. Please remember, our [place] providers are "right here with you" when you need [place].   Again, it was my pleasure to take care of you today. Thank you for choosing our clinic. I hope that you start to feel better quickly.   Honor Loh, MSN, APRN, FNP-C, CEN Advanced Practice Provider [facility]

## 2019-04-01 ENCOUNTER — Encounter: Payer: Self-pay | Admitting: Emergency Medicine

## 2019-04-01 ENCOUNTER — Other Ambulatory Visit: Payer: Self-pay

## 2019-04-01 ENCOUNTER — Ambulatory Visit
Admission: EM | Admit: 2019-04-01 | Discharge: 2019-04-01 | Disposition: A | Discharge status: Home / Self Care | Payer: BC Managed Care – PPO | Attending: Urgent Care | Admitting: Urgent Care

## 2019-04-01 DIAGNOSIS — N898 Other specified noninflammatory disorders of vagina: Secondary | ICD-10-CM | POA: Diagnosis present

## 2019-04-01 DIAGNOSIS — B9689 Other specified bacterial agents as the cause of diseases classified elsewhere: Secondary | ICD-10-CM | POA: Diagnosis present

## 2019-04-01 DIAGNOSIS — B373 Candidiasis of vulva and vagina: Secondary | ICD-10-CM | POA: Insufficient documentation

## 2019-04-01 DIAGNOSIS — Z113 Encounter for screening for infections with a predominantly sexual mode of transmission: Secondary | ICD-10-CM | POA: Insufficient documentation

## 2019-04-01 DIAGNOSIS — N760 Acute vaginitis: Secondary | ICD-10-CM | POA: Insufficient documentation

## 2019-04-01 DIAGNOSIS — B3731 Acute candidiasis of vulva and vagina: Secondary | ICD-10-CM

## 2019-04-01 HISTORY — DX: Other specified bacterial agents as the cause of diseases classified elsewhere: N76.0

## 2019-04-01 HISTORY — DX: Other specified bacterial agents as the cause of diseases classified elsewhere: B96.89

## 2019-04-01 LAB — WET PREP, GENITAL
Sperm: NONE SEEN
Trich, Wet Prep: NONE SEEN

## 2019-04-01 LAB — URINALYSIS, COMPLETE (UACMP) WITH MICROSCOPIC
Bilirubin Urine: NEGATIVE
Glucose, UA: NEGATIVE mg/dL
Hgb urine dipstick: NEGATIVE
Ketones, ur: NEGATIVE mg/dL
Leukocytes,Ua: NEGATIVE
Nitrite: NEGATIVE
Protein, ur: NEGATIVE mg/dL
Specific Gravity, Urine: 1.025 (ref 1.005–1.030)
pH: 7.5 (ref 5.0–8.0)

## 2019-04-01 LAB — PREGNANCY, URINE: Preg Test, Ur: NEGATIVE

## 2019-04-01 MED ORDER — FLUCONAZOLE 150 MG PO TABS: ORAL_TABLET | ORAL | 0 refills | Status: DC

## 2019-04-01 MED ORDER — METRONIDAZOLE 500 MG PO TABS: 500.0000 mg | ORAL_TABLET | Freq: Two times a day (BID) | ORAL | 0 refills | Status: DC

## 2019-04-01 NOTE — ED Triage Notes (Signed)
Patient here for STD testing. She states she has not been exposed to any STD that she is aware. She states that she has been getting BV more than she feels like she should. She does endorse vaginal itching, denies discharge.

## 2019-04-01 NOTE — Discharge Instructions (Addendum)
It was very nice seeing you today in clinic. Thank you for entrusting me with your care.   Urine and pregnancy test negative Wet prep swab (+) for yeast and BV Gonorrhea and chlamydia testing pending.   Take medications as prescribed. REMEMBER... NO alcohol while taking the Flagyl as it will make you VERY SICK. Increase fluid intake to prevent development of a urinary tract infection.   Make arrangements to follow up with your regular doctor in 1 week for re-evaluation if not improving. If your symptoms/condition worsens, please seek follow up care either here or in the ER. Please remember, our [facility] providers are "right here with you" when you need [place].   Again, it was my pleasure to take care of you today. Thank you for choosing our clinic. I hope that you start to feel better quickly.   Quentin Mulling, MSN, APRN, FNP-C, CEN Advanced Practice Provider [facility] [place] [facility]

## 2019-04-01 NOTE — ED Provider Notes (Signed)
[place]   Name: Kristen Charles DOB: 04/06/1995 MRN: [id number] CSN: [id number] PCP: Cathie Hoops, PA  Arrival date and time:  04/01/19 1214  Chief Complaint:  Vaginal Itching   NOTE: Prior to seeing the patient today, I have reviewed the triage nursing documentation and vital signs. Clinical staff has updated patient's PMH/PSHx, current medication list, and drug allergies/intolerances to ensure comprehensive history available to assist in medical decision making.   History:   HPI: Kristen Charles is a 24 y.o. female who presents today with complaints of vaginal itching that began approximately 2 weeks ago. She denies urinary symptoms; no dysuria, gross hematuria, frequency, or urgency. No fevers or chills. Patient is not experiencing any associated vaginal/pelvic pain. She has not appreciated any bleeding. Patient denies any associated nausea, vomiting, fever/chills, or pain in her lower back, flank area, or abdomen. Patient advises that she does not have a significant history for STIs in the past, however endorses that she engages in unprotected sexual activity and wishes to be tested for gonorrhea and chlamydia today. She notes that sexual activity is in the context of a committed monogamous relationship with a single female partner. Patient's last menstrual period was 03/27/2019. There are no concerns that she is currently pregnant.   Past Medical History:  Diagnosis Date  . ADD (attention deficit disorder)   . BV (bacterial vaginosis)    Recurrent  . Obesity     Past Surgical History:  Procedure Laterality Date  . NO PAST SURGERIES      Family History  Problem Relation Age of Onset  . Healthy Mother     Social History   Tobacco Use  . Smoking status: Never Smoker  . Smokeless tobacco: Never Used  Substance Use Topics  . Alcohol use: No  . Drug use: No    Patient Active Problem List   Diagnosis Date Noted  . Laceration of left index finger without  damage to nail 08/25/2016  . Need for prophylactic vaccination with combined diphtheria-tetanus-pertussis (DTP) vaccine 08/25/2016    Home Medications:    No outpatient medications have been marked as taking for the 04/01/19 encounter [facility] Encounter).    Allergies:   Patient has no known allergies.  Review of Systems (ROS): Review of Systems  Constitutional: Negative for chills and fever.  Respiratory: Negative for cough and shortness of breath.   Cardiovascular: Negative for chest pain and palpitations.  Gastrointestinal: Negative for abdominal pain, nausea and vomiting.  Genitourinary: Negative for decreased urine volume, dysuria, flank pain, frequency, hematuria, pelvic pain, urgency, vaginal bleeding, vaginal discharge and vaginal pain.       (+) vaginal itching  Musculoskeletal: Negative for back pain.  Skin: Negative for color change, pallor and rash.  Neurological: Negative for dizziness, syncope, weakness and headaches.  All other systems reviewed and are negative.    Vital Signs: Today's Vitals   04/01/19 1225 04/01/19 1227 04/01/19 1327  BP:  129/67   Pulse:  69   Resp:  18   Temp:  99 F (37.2 C)   TempSrc:  Oral   SpO2:  100%   Weight: 248 lb (112.5 kg)    Height: 5\' 6"  (1.676 m)    PainSc: 0-No pain  0-No pain    Physical Exam: Physical Exam  Constitutional: She is oriented to person, place, and time and well-developed, well-nourished, and in no distress.  HENT:  Head: Normocephalic and atraumatic.  Eyes: Pupils are equal, round, and reactive to  light.  Neck: No tracheal deviation present.  Cardiovascular: Normal rate, regular rhythm, normal heart sounds and intact distal pulses.  Pulmonary/Chest: Effort normal and breath sounds normal.  Abdominal: Soft. Bowel sounds are normal. She exhibits no distension. There is no abdominal tenderness.  Genitourinary:    Genitourinary Comments: Exam deferred. No vaginal/pelvic pain or bleeding. Patient is not  currently pregnant. She has elected to self collect specimen swab for wet prep and DNA probe for GC.   Musculoskeletal:     Cervical back: Normal range of motion and neck supple.  Neurological: She is alert and oriented to person, place, and time. Gait normal.  Skin: Skin is warm and dry. No rash noted. She is not diaphoretic.  Psychiatric: Mood, memory, affect and judgment normal.  Nursing note and vitals reviewed.   Urgent Care Treatments / Results:   Orders Placed This Encounter  Procedures  . Wet prep, genital  . GC/Chlamydia Probe Amp  . Urinalysis, Complete w Microscopic  . Pregnancy, urine    LABS: PLEASE NOTE: all labs that were ordered this encounter are listed, however only abnormal results are displayed. Labs Reviewed  WET PREP, GENITAL - Abnormal; Notable for the following components:      Result Value   Yeast Wet Prep HPF POC PRESENT (*)    Clue Cells Wet Prep HPF POC PRESENT (*)    WBC, Wet Prep HPF POC FEW (*)    All other components within normal limits  URINALYSIS, COMPLETE (UACMP) WITH MICROSCOPIC - Abnormal; Notable for the following components:   APPearance HAZY (*)    Bacteria, UA FEW (*)    All other components within normal limits  GC/CHLAMYDIA PROBE AMP  PREGNANCY, URINE    EKG: -None  RADIOLOGY: No results found.  PROCEDURES: Procedures  MEDICATIONS RECEIVED THIS VISIT: Medications - No data to display  PERTINENT CLINICAL COURSE NOTES/UPDATES:   Initial Impression / Assessment and Plan / Urgent Care Course:  Pertinent labs & imaging results that were available during my care of the patient were personally reviewed by me and considered in my medical decision making (see lab/imaging section of note for values and interpretations).  Kristen Charles is a 24 y.o. female who presents to [facility] today with complaints of Vaginal Itching   Patient is well appearing overall in clinic today. She does not appear to be in any acute  distress. Presenting symptoms (see HPI) and exam as documented above. UA (-) for infection. Urine HCG (-) for pregnancy.  DNA probe for GC collected and sent to the lab for testing. Patient aware that she will be contacted with further treatment directives should her testing result as positive. Wet prep (+) for clue cells and yeast, which is consistent with bacterial vaginosis (BV) and vulvovaginal yeast infection.    Will treat with a 7 day course of oral metronidazole. Patient encouraged to complete the entire course of antibiotics even if she begins to feel better. Educated on need to avoid all ETOH while she is taking this medication in order to prevent a disulfiram like reaction that will result in significant nausea and vomiting. Patient encouraged to increase her fluid intake as much as possible. Discussed that water is always best to flush the urinary tract and prevent development of a urinary tract infection while on treatment for the BV.   Will send in fluconazole dose (150 mg x 1 - may repeat in 72 hours if still symptomatic) for patient to use for candidiasis  symptoms.  Discussed follow up with primary care physician in 1 week for re-evaluation. I have reviewed the follow up and strict return precautions for any new or worsening symptoms. Patient is aware of symptoms that would be deemed urgent/emergent, and would thus require further evaluation either here or in the emergency department. At the time of discharge, she verbalized understanding and consent with the discharge plan as it was reviewed with her. All questions were fielded by provider and/or clinic staff prior to patient discharge.    Final Clinical Impressions / Urgent Care Diagnoses:   Final diagnoses:  BV (bacterial vaginosis)  Vulvovaginal candidiasis  Vaginal itching  Screen for STD (sexually transmitted disease)    New Prescriptions:  [place] Controlled Substance Registry consulted? Not Applicable  Meds ordered this  encounter  Medications  . metroNIDAZOLE (FLAGYL) 500 MG tablet    Sig: Take 1 tablet (500 mg total) by mouth 2 (two) times daily.    Dispense:  14 tablet    Refill:  0  . fluconazole (DIFLUCAN) 150 MG tablet    Sig: Take 1 tablet (150 mg) PO x 1 dose. May repeat 150 mg dose in 3 days if still symptomatic.    Dispense:  2 tablet    Refill:  0    Recommended Follow up Care:  Patient encouraged to follow up with the following provider within the specified time frame, or sooner as dictated by the severity of her symptoms. As always, she was instructed that for any urgent/emergent care needs, she should seek care either here or in the emergency department for more immediate evaluation.  Follow-up Information    Midge Minium, PA In 1 week.   Specialty: Family Medicine Why: General reassessment of symptoms if not improving Contact information: [place] [address] [phone]         NOTE: This note was prepared using Dragon dictation software along with smaller phrase technology. Despite my best ability to proofread, there is the potential that transcriptional errors may still occur from this process, and are completely unintentional.    Karen Kitchens, NP 04/01/19 2234

## 2019-04-03 LAB — GC/CHLAMYDIA PROBE AMP
Chlamydia trachomatis, NAA: NEGATIVE
Neisseria Gonorrhoeae by PCR: NEGATIVE

## 2019-04-24 ENCOUNTER — Encounter: Payer: Self-pay | Admitting: Emergency Medicine

## 2019-04-24 ENCOUNTER — Ambulatory Visit
Admission: EM | Admit: 2019-04-24 | Discharge: 2019-04-24 | Disposition: A | Discharge status: Home / Self Care | Payer: BC Managed Care – PPO | Attending: Internal Medicine | Admitting: Internal Medicine

## 2019-04-24 ENCOUNTER — Other Ambulatory Visit: Payer: Self-pay

## 2019-04-24 DIAGNOSIS — Z9189 Other specified personal risk factors, not elsewhere classified: Secondary | ICD-10-CM

## 2019-04-24 DIAGNOSIS — Z20822 Contact with and (suspected) exposure to covid-19: Secondary | ICD-10-CM | POA: Diagnosis present

## 2019-04-24 NOTE — ED Triage Notes (Signed)
Pt present to [facility] for covid testing. She states she was exposed to her brother and mother that recently tested positive. Pt denies symptoms.

## 2019-04-24 NOTE — ED Provider Notes (Signed)
[facility]    CSN: [id number] Arrival date & time: 04/24/19  1248      History   Chief Complaint Chief Complaint  Patient presents with  . COVID testing    HPI Kristen Charles is a 24 y.o. female is here for COVID-19 testing.  Patient was exposed to COVID-19 positive individuals.  She currently denies any symptoms.Marland Kitchen   HPI  Past Medical History:  Diagnosis Date  . ADD (attention deficit disorder)   . BV (bacterial vaginosis)    Recurrent  . Obesity     Patient Active Problem List   Diagnosis Date Noted  . Laceration of left index finger without damage to nail 08/25/2016  . Need for prophylactic vaccination with combined diphtheria-tetanus-pertussis (DTP) vaccine 08/25/2016    Past Surgical History:  Procedure Laterality Date  . NO PAST SURGERIES      OB History   No obstetric history on file.      Home Medications    Prior to Admission medications   Not on File    Family History Family History  Problem Relation Age of Onset  . Healthy Mother     Social History Social History   Tobacco Use  . Smoking status: Never Smoker  . Smokeless tobacco: Never Used  Substance Use Topics  . Alcohol use: No  . Drug use: No     Allergies   Patient has no known allergies.   Review of Systems Review of Systems  Constitutional: Negative for fever.  HENT: Negative for congestion, ear discharge and ear pain.   Respiratory: Negative for cough and shortness of breath.   Cardiovascular: Negative for chest pain.  Neurological: Negative for headaches.     Physical Exam Triage Vital Signs ED Triage Vitals  Enc Vitals Group     BP 04/24/19 1303 122/72     Pulse Rate 04/24/19 1303 79     Resp 04/24/19 1303 18     Temp 04/24/19 1303 98.5 F (36.9 C)     Temp Source 04/24/19 1303 Oral     SpO2 04/24/19 1303 100 %     Weight 04/24/19 1304 244 lb (110.7 kg)     Height 04/24/19 1304 5\' 6"  (1.676 m)     Head Circumference --      Peak Flow --       Pain Score 04/24/19 1303 0     Pain Loc --      Pain Edu? --      Excl. in GC? --    No data found.  Updated Vital Signs BP 122/72 (BP Location: Left Arm)   Pulse 79   Temp 98.5 F (36.9 C) (Oral)   Resp 18   Ht 5\' 6"  (1.676 m)   Wt 110.7 kg   LMP 04/13/2019 (Approximate)   SpO2 100%   BMI 39.38 kg/m   Visual Acuity Right Eye Distance:   Left Eye Distance:   Bilateral Distance:    Right Eye Near:   Left Eye Near:    Bilateral Near:     Physical Exam Vitals and nursing note reviewed.  Constitutional:      General: She is not in acute distress.    Appearance: She is not ill-appearing.  Skin:    Capillary Refill: Capillary refill takes less than 2 seconds.  Neurological:     Mental Status: She is alert.      UC Treatments / Results  Labs (all labs ordered are listed, but only  abnormal results are displayed) Labs Reviewed  NOVEL CORONAVIRUS, NAA (HOSP ORDER, SEND-OUT TO REF LAB; TAT 18-24 HRS)    EKG   Radiology No results found.  Procedures Procedures (including critical care time)  Medications Ordered in UC Medications - No data to display  Initial Impression / Assessment and Plan / UC Course  I have reviewed the triage vital signs and the nursing notes.  Pertinent labs & imaging results that were available during my care of the patient were reviewed by me and considered in my medical decision making (see chart for details).     1.  Exposure to COVID-19 positive individual: COVID-19 PCR test sent If patient develops symptoms she is welcome to reach out to the urgent care via virtual visit platform or in person. Final Clinical Impressions(s) / UC Diagnoses   Final diagnoses:  At increased risk of exposure to COVID-19 virus   Discharge Instructions   None    ED Prescriptions    None     PDMP not reviewed this encounter.   Chase Picket, MD 04/24/19 [id number]

## 2019-04-25 LAB — NOVEL CORONAVIRUS, NAA (HOSP ORDER, SEND-OUT TO REF LAB; TAT 18-24 HRS): SARS-CoV-2, NAA: NOT DETECTED

## 2019-08-17 ENCOUNTER — Other Ambulatory Visit: Payer: BC Managed Care – PPO

## 2019-10-02 ENCOUNTER — Other Ambulatory Visit: Payer: Self-pay

## 2019-10-02 ENCOUNTER — Ambulatory Visit
Admission: EM | Admit: 2019-10-02 | Discharge: 2019-10-02 | Disposition: A | Discharge status: Home / Self Care | Payer: BC Managed Care – PPO | Attending: Family Medicine | Admitting: Family Medicine

## 2019-10-02 ENCOUNTER — Encounter: Payer: Self-pay | Admitting: Emergency Medicine

## 2019-10-02 DIAGNOSIS — N760 Acute vaginitis: Secondary | ICD-10-CM | POA: Insufficient documentation

## 2019-10-02 DIAGNOSIS — B3731 Acute candidiasis of vulva and vagina: Secondary | ICD-10-CM

## 2019-10-02 DIAGNOSIS — B373 Candidiasis of vulva and vagina: Secondary | ICD-10-CM | POA: Diagnosis present

## 2019-10-02 DIAGNOSIS — B9689 Other specified bacterial agents as the cause of diseases classified elsewhere: Secondary | ICD-10-CM | POA: Insufficient documentation

## 2019-10-02 LAB — WET PREP, GENITAL
Sperm: NONE SEEN
Trich, Wet Prep: NONE SEEN

## 2019-10-02 MED ORDER — METRONIDAZOLE 500 MG PO TABS
500.0000 mg | ORAL_TABLET | Freq: Two times a day (BID) | ORAL | 0 refills | Status: DC
Start: 2019-10-02 — End: 2020-03-22

## 2019-10-02 MED ORDER — FLUCONAZOLE 150 MG PO TABS
150.0000 mg | ORAL_TABLET | Freq: Every day | ORAL | 0 refills | Status: DC
Start: 2019-10-02 — End: 2020-03-22

## 2019-10-02 NOTE — ED Triage Notes (Signed)
Patient c/o vaginal itching and white vaginal discharge for the past 2-3 days. Patient reports some urinary frequency.

## 2019-10-02 NOTE — Discharge Instructions (Addendum)
Take medication as prescribed. Rest. Drink plenty of fluids. Pelvic rest. Follow up with your OBGYN.   Follow up with your primary care physician this week as needed. Return to Urgent care for new or worsening concerns.

## 2019-10-02 NOTE — ED Provider Notes (Signed)
[facility] ____________________________________________  Time seen: Approximately 9:59 AM  I have reviewed the triage vital signs and the nursing notes.   HISTORY  Chief Complaint Vaginal Itching and Vaginal Discharge   HPI Kristen Charles is a 24 y.o. female presenting for evaluation of 2 to 3 days of vaginal itching, white vaginal discharge.  States discharge at times has odor, not always.  Denies concerns of STDs.  States does not believe she has a urinary tract infection.  Patient was unable to give a urine sample prior to speaking with patient, and patient declined urine specimen and testing.  Denies urinary frequency, urinary urgency or burning with urination.  Denies abdominal pain, back pain, rash or other changes.  Reports otherwise doing well.  States this is similar to previous recurrent bacterial vaginosis.  Denies aggravating or alleviating factors.  Cathie Hoops, [place] : PCP    Past Medical History:  Diagnosis Date  . ADD (attention deficit disorder)   . BV (bacterial vaginosis)    Recurrent  . Obesity     Patient Active Problem List   Diagnosis Date Noted  . Laceration of left index finger without damage to nail 08/25/2016  . Need for prophylactic vaccination with combined diphtheria-tetanus-pertussis (DTP) vaccine 08/25/2016    Past Surgical History:  Procedure Laterality Date  . NO PAST SURGERIES       No current facility-administered medications for this encounter.  Current Outpatient Medications:  .  fluconazole (DIFLUCAN) 150 MG tablet, Take 1 tablet (150 mg total) by mouth daily. Take one pill orally, then Repeat in one week as needed., Disp: 2 tablet, Rfl: 0 .  metroNIDAZOLE (FLAGYL) 500 MG tablet, Take 1 tablet (500 mg total) by mouth 2 (two) times daily., Disp: 14 tablet, Rfl: 0  Allergies Patient has no known allergies.  Family History  Problem Relation Age of Onset  . Healthy Mother     Social History Social History     Tobacco Use  . Smoking status: Never Smoker  . Smokeless tobacco: Never Used  Vaping Use  . Vaping Use: Every day  Substance Use Topics  . Alcohol use: No  . Drug use: No    Review of Systems Constitutional: No fever ENT: No sore throat. Cardiovascular: Denies chest pain. Respiratory: Denies shortness of breath. Gastrointestinal: No abdominal pain.  No nausea, no vomiting.  No diarrhea.   Genitourinary: As above.  Musculoskeletal: Negative for back pain. Skin: Negative for rash.  ____________________________________________   PHYSICAL EXAM:  VITAL SIGNS: ED Triage Vitals  Enc Vitals Group     BP 10/02/19 0903 113/64     Pulse Rate 10/02/19 0903 69     Resp 10/02/19 0903 14     Temp 10/02/19 0903 98.4 F (36.9 C)     Temp Source 10/02/19 0903 Oral     SpO2 10/02/19 0903 98 %     Weight 10/02/19 0901 240 lb (108.9 kg)     Height 10/02/19 0901 5\' 6"  (1.676 m)     Head Circumference --      Peak Flow --      Pain Score 10/02/19 0900 4     Pain Loc --      Pain Edu? --      Excl. in [place]? --     Constitutional: Alert and oriented. Well appearing and in no acute distress. Eyes: Conjunctivae are normal.  ENT      Head: Normocephalic and atraumatic. Cardiovascular: Normal rate, regular rhythm.  Grossly normal heart sounds.  Good peripheral circulation. Respiratory: Normal respiratory effort without tachypnea nor retractions. Breath sounds are clear and equal bilaterally. No wheezes, rales, rhonchi. Gastrointestinal: Soft and nontender.  No CVA tenderness. Musculoskeletal: Steady gait.  Neurologic:  Normal speech and language.  Skin:  Skin is warm, dry and intact. No rash noted. Psychiatric: Mood and affect are normal. Speech and behavior are normal. Patient exhibits appropriate insight and judgment   ___________________________________________   LABS (all labs ordered are listed, but only abnormal results are displayed)  Labs Reviewed  WET PREP, GENITAL -  Abnormal; Notable for the following components:      Result Value   Yeast Wet Prep HPF POC PRESENT (*)    Clue Cells Wet Prep HPF POC PRESENT (*)    WBC, Wet Prep HPF POC FEW (*)    All other components within normal limits     PROCEDURES Procedures    INITIAL IMPRESSION / ASSESSMENT AND PLAN / ED COURSE  Pertinent labs & imaging results that were available during my care of the patient were reviewed by me and considered in my medical decision making (see chart for details).  Well-appearing patient.  No acute distress.  Patient declined urine exam.  Patient active for self wet prep.  Wet prep positive for BV and yeast.  Discussed treatment options with patient, will treat with oral Flagyl and Diflucan.  Encourage rest, fluids, supportive care, pelvic rest.  Patient reports that this has been a recurrent situation for bacterial vaginosis, encouraged OB/GYN follow-up.Discussed indication, risks and benefits of medications with patient.   Discussed follow up with Primary care physician this week as needed. Discussed follow up and return parameters including no resolution or any worsening concerns. Patient verbalized understanding and agreed to plan.   ____________________________________________   FINAL CLINICAL IMPRESSION(S) / ED DIAGNOSES  Final diagnoses:  BV (bacterial vaginosis)  Yeast vaginitis     ED Discharge Orders         Ordered    metroNIDAZOLE (FLAGYL) 500 MG tablet  2 times daily     Discontinue  Reprint     10/02/19 0958    fluconazole (DIFLUCAN) 150 MG tablet  Daily     Discontinue  Reprint     10/02/19 0958           Note: This dictation was prepared with Dragon dictation along with smaller phrase technology. Any transcriptional errors that result from this process are unintentional.         Renford Dills, NP 10/02/19 1041

## 2019-10-22 ENCOUNTER — Other Ambulatory Visit: Payer: Self-pay

## 2019-10-22 ENCOUNTER — Ambulatory Visit
Admission: EM | Admit: 2019-10-22 | Discharge: 2019-10-22 | Disposition: A | Discharge status: Home / Self Care | Payer: BC Managed Care – PPO | Attending: Family Medicine | Admitting: Family Medicine

## 2019-10-22 DIAGNOSIS — Z6838 Body mass index (BMI) 38.0-38.9, adult: Secondary | ICD-10-CM | POA: Insufficient documentation

## 2019-10-22 DIAGNOSIS — Z20822 Contact with and (suspected) exposure to covid-19: Secondary | ICD-10-CM | POA: Diagnosis not present

## 2019-10-22 DIAGNOSIS — Z79899 Other long term (current) drug therapy: Secondary | ICD-10-CM | POA: Diagnosis not present

## 2019-10-22 DIAGNOSIS — Z8616 Personal history of COVID-19: Secondary | ICD-10-CM | POA: Insufficient documentation

## 2019-10-22 DIAGNOSIS — J22 Unspecified acute lower respiratory infection: Secondary | ICD-10-CM | POA: Diagnosis present

## 2019-10-22 DIAGNOSIS — Z7952 Long term (current) use of systemic steroids: Secondary | ICD-10-CM | POA: Diagnosis not present

## 2019-10-22 DIAGNOSIS — E669 Obesity, unspecified: Secondary | ICD-10-CM | POA: Insufficient documentation

## 2019-10-22 MED ORDER — ALBUTEROL SULFATE HFA 108 (90 BASE) MCG/ACT IN AERS: 1.0000 | INHALATION_SPRAY | Freq: Four times a day (QID) | RESPIRATORY_TRACT | 0 refills | Status: DC | PRN

## 2019-10-22 MED ORDER — AZITHROMYCIN 250 MG PO TABS: ORAL_TABLET | ORAL | 0 refills | Status: AC

## 2019-10-22 MED ORDER — PREDNISONE 20 MG PO TABS: 40.0000 mg | ORAL_TABLET | Freq: Every day | ORAL | 0 refills | Status: AC

## 2019-10-22 NOTE — Discharge Instructions (Signed)
Self isolate until covid results are back and negative.  Will notify you by phone of any positive findings. Your negative results will be sent through your MyChart.     Push fluids to ensure adequate hydration and keep secretions thin.  Complete course of antibiotics.  Use of inhaler as needed for wheezing or shortness of breath.   5 days of prednisone (may stop this sooner if your symptoms have resolved).  Over the counter medications such as mucinex as needed for symptoms.  If symptoms worsen or do not improve in the next week to return to be seen or to follow up with your PCP.

## 2019-10-22 NOTE — ED Provider Notes (Signed)
[facility]    CSN: [id number] Arrival date & time: 10/22/19  1205      History   Chief Complaint Chief Complaint  Patient presents with  . Cough  . Nasal Congestion    HPI Kristen Charles is a 24 y.o. female.   Kristen Charles presents with complaints of congestion, cough, nasal drainage. Cough is productive. No known fevers, but has felt hot. No shortness of breath . Deep inhalation triggers cough. No sore throat, no ear pain. No headache or body aches. Has had covid-19, approximately 4 months ago. This does not feel similar. Vapes. No asthma or COPD. A child she was around had a cold, otherwise no ill contacts.  Has taken mucinex and tylenol which have helped some.    ROS per HPI, negative if not otherwise mentioned.      Past Medical History:  Diagnosis Date  . ADD (attention deficit disorder)   . BV (bacterial vaginosis)    Recurrent  . Obesity     Patient Active Problem List   Diagnosis Date Noted  . Laceration of left index finger without damage to nail 08/25/2016  . Need for prophylactic vaccination with combined diphtheria-tetanus-pertussis (DTP) vaccine 08/25/2016    Past Surgical History:  Procedure Laterality Date  . NO PAST SURGERIES      OB History   No obstetric history on file.      Home Medications    Prior to Admission medications   Medication Sig Start Date End Date Taking? Authorizing Provider  albuterol (PROAIR HFA) 108 (90 Base) MCG/ACT inhaler Inhale 1-2 puffs into the lungs every 6 (six) hours as needed for wheezing or shortness of breath. 10/22/19   Georgetta Haber, NP  azithromycin (ZITHROMAX) 250 MG tablet Take 2 tablets (500 mg total) by mouth daily for 1 day, THEN 1 tablet (250 mg total) daily for 4 days. 10/22/19 10/27/19  Georgetta Haber, NP  fluconazole (DIFLUCAN) 150 MG tablet Take 1 tablet (150 mg total) by mouth daily. Take one pill orally, then Repeat in one week as needed. 10/02/19   Renford Dills, NP    metroNIDAZOLE (FLAGYL) 500 MG tablet Take 1 tablet (500 mg total) by mouth 2 (two) times daily. 10/02/19   Renford Dills, NP  predniSONE (DELTASONE) 20 MG tablet Take 2 tablets (40 mg total) by mouth daily with breakfast for 5 days. 10/22/19 10/27/19  Georgetta Haber, NP    Family History Family History  Problem Relation Age of Onset  . Healthy Mother     Social History Social History   Tobacco Use  . Smoking status: Never Smoker  . Smokeless tobacco: Never Used  Vaping Use  . Vaping Use: Every day  Substance Use Topics  . Alcohol use: No  . Drug use: No     Allergies   Patient has no known allergies.   Review of Systems Review of Systems   Physical Exam Triage Vital Signs ED Triage Vitals  Enc Vitals Group     BP 10/22/19 1239 111/67     Pulse Rate 10/22/19 1239 61     Resp 10/22/19 1239 18     Temp 10/22/19 1239 (!) 97.5 F (36.4 C)     Temp Source 10/22/19 1239 Oral     SpO2 10/22/19 1239 98 %     Weight 10/22/19 1245 240 lb (108.9 kg)     Height 10/22/19 1245 5\' 6"  (1.676 m)     Head Circumference --  Peak Flow --      Pain Score 10/22/19 1245 0     Pain Loc --      Pain Edu? --      Excl. in GC? --    No data found.  Updated Vital Signs BP 111/67 (BP Location: Right Arm)   Pulse 61   Temp (!) 97.5 F (36.4 C) (Oral)   Resp 18   Ht 5\' 6"  (1.676 m)   Wt 240 lb (108.9 kg)   LMP 10/07/2019   SpO2 98%   BMI 38.74 kg/m   Visual Acuity Right Eye Distance:   Left Eye Distance:   Bilateral Distance:    Right Eye Near:   Left Eye Near:    Bilateral Near:     Physical Exam Constitutional:      General: She is not in acute distress.    Appearance: She is well-developed.  Cardiovascular:     Rate and Rhythm: Normal rate.  Pulmonary:     Effort: Pulmonary effort is normal.     Breath sounds: Wheezing present.     Comments: Congested cough noted  Skin:    General: Skin is warm and dry.  Neurological:     Mental Status: She is alert  and oriented to person, place, and time.      UC Treatments / Results  Labs (all labs ordered are listed, but only abnormal results are displayed) Labs Reviewed  SARS CORONAVIRUS 2 (TAT 6-24 HRS)    EKG   Radiology No results found.  Procedures Procedures (including critical care time)  Medications Ordered in UC Medications - No data to display  Initial Impression / Assessment and Plan / UC Course  I have reviewed the triage vital signs and the nursing notes.  Pertinent labs & imaging results that were available during my care of the patient were reviewed by me and considered in my medical decision making (see chart for details).     URI, wheezing, productive cough. Prednisone and inhaler provided, as well as azithromycin. covid testing collected and pending although she has had covid <6 months ago and no known exposure. Return precautions provided. Patient verbalized understanding and agreeable to plan.   Final Clinical Impressions(s) / UC Diagnoses   Final diagnoses:  Lower respiratory tract infection     Discharge Instructions     Self isolate until covid results are back and negative.  Will notify you by phone of any positive findings. Your negative results will be sent through your MyChart.     Push fluids to ensure adequate hydration and keep secretions thin.  Complete course of antibiotics.  Use of inhaler as needed for wheezing or shortness of breath.   5 days of prednisone (may stop this sooner if your symptoms have resolved).  Over the counter medications such as mucinex as needed for symptoms.  If symptoms worsen or do not improve in the next week to return to be seen or to follow up with your PCP.      ED Prescriptions    Medication Sig Dispense Auth. Provider   predniSONE (DELTASONE) 20 MG tablet Take 2 tablets (40 mg total) by mouth daily with breakfast for 5 days. 10 tablet 10/09/2019 B, NP   azithromycin (ZITHROMAX) 250 MG tablet Take 2  tablets (500 mg total) by mouth daily for 1 day, THEN 1 tablet (250 mg total) daily for 4 days. 6 tablet Linus Mako B, NP   albuterol (PROAIR HFA) 108 (90 Base)  MCG/ACT inhaler Inhale 1-2 puffs into the lungs every 6 (six) hours as needed for wheezing or shortness of breath. 8 g Georgetta Haber, NP     PDMP not reviewed this encounter.   Linus Mako B, NP 10/22/19 1330

## 2019-10-22 NOTE — ED Triage Notes (Signed)
Patient in today w/ c/o cough, sinus drainage, and chest congestion x 4-5 days.. Patient denies fever, loss of taste or smell.

## 2019-10-23 LAB — SARS CORONAVIRUS 2 (TAT 6-24 HRS): SARS Coronavirus 2: NEGATIVE

## 2020-03-22 ENCOUNTER — Other Ambulatory Visit: Payer: Self-pay

## 2020-03-22 ENCOUNTER — Ambulatory Visit
Admission: EM | Admit: 2020-03-22 | Discharge: 2020-03-22 | Disposition: A | Discharge status: Home / Self Care | Payer: BC Managed Care – PPO | Attending: Emergency Medicine | Admitting: Emergency Medicine

## 2020-03-22 DIAGNOSIS — Z20822 Contact with and (suspected) exposure to covid-19: Secondary | ICD-10-CM | POA: Diagnosis not present

## 2020-03-22 DIAGNOSIS — J029 Acute pharyngitis, unspecified: Secondary | ICD-10-CM | POA: Diagnosis present

## 2020-03-22 LAB — GROUP A STREP BY PCR: Group A Strep by PCR: NOT DETECTED

## 2020-03-22 NOTE — ED Provider Notes (Signed)
[facility]    CSN: [id number] Arrival date & time: 03/22/20  1548      History   Chief Complaint Chief Complaint  Patient presents with  . Sore Throat    HPI Kristen Charles is a 25 y.o. female.   HPI   25 year old female here for evaluation of a scratchy throat, painful swallowing, nasal congestion, and headache that started this morning.  Patient denies fever, runny nose, ear pain or pressure, cough, nausea or vomiting, body aches, or changes her sense of taste or smell.  Patient states that her sister has similar symptoms.  Patient is only received 1 dose of the Covid vaccine and that was 3 weeks ago.  Past Medical History:  Diagnosis Date  . ADD (attention deficit disorder)   . BV (bacterial vaginosis)    Recurrent  . Obesity     Patient Active Problem List   Diagnosis Date Noted  . Laceration of left index finger without damage to nail 08/25/2016  . Need for prophylactic vaccination with combined diphtheria-tetanus-pertussis (DTP) vaccine 08/25/2016    Past Surgical History:  Procedure Laterality Date  . NO PAST SURGERIES      OB History   No obstetric history on file.      Home Medications    Prior to Admission medications   Medication Sig Start Date End Date Taking? Authorizing Provider  albuterol (PROAIR HFA) 108 (90 Base) MCG/ACT inhaler Inhale 1-2 puffs into the lungs every 6 (six) hours as needed for wheezing or shortness of breath. 10/22/19 03/22/20  Zigmund Gottron, NP    Family History Family History  Problem Relation Age of Onset  . Healthy Mother     Social History Social History   Tobacco Use  . Smoking status: Never Smoker  . Smokeless tobacco: Never Used  Vaping Use  . Vaping Use: Every day  Substance Use Topics  . Alcohol use: No  . Drug use: No     Allergies   Patient has no known allergies.   Review of Systems Review of Systems  Constitutional: Negative for activity change, appetite change and fever.   HENT: Positive for congestion, sinus pressure and sore throat. Negative for ear pain.   Respiratory: Negative for cough, shortness of breath and wheezing.   Gastrointestinal: Negative for diarrhea and vomiting.  Musculoskeletal: Negative for arthralgias and myalgias.  Neurological: Positive for headaches.  Hematological: Negative.   Psychiatric/Behavioral: Negative.      Physical Exam Triage Vital Signs ED Triage Vitals  Enc Vitals Group     BP 03/22/20 1857 110/65     Pulse Rate 03/22/20 1857 (!) 101     Resp 03/22/20 1857 18     Temp 03/22/20 1857 98.7 F (37.1 C)     Temp Source 03/22/20 1857 Oral     SpO2 03/22/20 1857 100 %     Weight 03/22/20 1749 238 lb (108 kg)     Height 03/22/20 1749 5\' 6"  (1.676 m)     Head Circumference --      Peak Flow --      Pain Score 03/22/20 1748 7     Pain Loc --      Pain Edu? --      Excl. in [place]? --    No data found.  Updated Vital Signs BP 110/65 (BP Location: Left Arm)   Pulse (!) 101   Temp 98.7 F (37.1 C) (Oral)   Resp 18   Ht 5\' 6"  (  1.676 m)   Wt 238 lb (108 kg)   LMP 03/03/2020   SpO2 100%   BMI 38.41 kg/m   Visual Acuity Right Eye Distance:   Left Eye Distance:   Bilateral Distance:    Right Eye Near:   Left Eye Near:    Bilateral Near:     Physical Exam Vitals and nursing note reviewed.  Constitutional:      General: She is not in acute distress.    Appearance: She is well-developed. She is not toxic-appearing.  HENT:     Head: Normocephalic and atraumatic.     Right Ear: Tympanic membrane and ear canal normal. Tympanic membrane is not erythematous.     Left Ear: Tympanic membrane and ear canal normal. Tympanic membrane is not erythematous.     Nose: Congestion and rhinorrhea present.     Mouth/Throat:     Mouth: Mucous membranes are moist.     Pharynx: Oropharynx is clear. Posterior oropharyngeal erythema present.  Cardiovascular:     Rate and Rhythm: Normal rate and regular rhythm.     Heart  sounds: Normal heart sounds. No murmur heard. No gallop.   Musculoskeletal:     Cervical back: Normal range of motion and neck supple.  Lymphadenopathy:     Cervical: No cervical adenopathy.  Skin:    General: Skin is warm and dry.     Capillary Refill: Capillary refill takes less than 2 seconds.     Findings: No erythema or rash.  Neurological:     General: No focal deficit present.     Mental Status: She is alert and oriented to person, place, and time.  Psychiatric:        Mood and Affect: Mood normal.        Behavior: Behavior normal.      UC Treatments / Results  Labs (all labs ordered are listed, but only abnormal results are displayed) Labs Reviewed  GROUP A STREP BY PCR  SARS CORONAVIRUS 2 (TAT 6-24 HRS)    EKG   Radiology No results found.  Procedures Procedures (including critical care time)  Medications Ordered in UC Medications - No data to display  Initial Impression / Assessment and Plan / UC Course  I have reviewed the triage vital signs and the nursing notes.  Pertinent labs & imaging results that were available during my care of the patient were reviewed by me and considered in my medical decision making (see chart for details).   Patient is here for evaluation of cold symptoms that started this morning.  Patient's physical exam reveals erythematous edematous nasal mucosa with some clear nasal discharge.  Posterior oropharynx is erythematous with clear postnasal drip.  Tonsillar pillars are unremarkable.  No cervical lymphadenopathy appreciated.  Lungs are clear to auscultation all fields.  Will swab patient for Covid and have her go home to isolate until her Covid results are back.  If she is positive she will need to quarantine for 10 days from the start of her symptoms.  Patient use over-the-counter Tylenol and ibuprofen as needed for pain or fever and salt water gargles to soothe her throat.  Patient given ER precautions.   Final Clinical  Impressions(s) / UC Diagnoses   Final diagnoses:  Pharyngitis, unspecified etiology     Discharge Instructions     Isolate at home until the results of your Covid test are back.  If you test positive you will need to quarantine for 10 days from the start of  your symptoms.  After the 10 days you can break quarantine if your symptoms have improved and you have not had a fever for 24 hours without taking Tylenol or ibuprofen.  Use over-the-counter Tylenol and ibuprofen as needed for pain and/or fever.  Gargle with warm salt water 2-3 times a day to soothe your throat and aid in pain relief.  If you develop any shortness of breath, especially at rest, you cannot speak full sentences, or you develop bluing around your lips you to go the ER for evaluation.    ED Prescriptions    None     PDMP not reviewed this encounter.   Becky Augusta, NP 03/22/20 1919

## 2020-03-22 NOTE — Discharge Instructions (Signed)
Isolate at home until the results of your Covid test are back.  If you test positive you will need to quarantine for 10 days from the start of your symptoms.  After the 10 days you can break quarantine if your symptoms have improved and you have not had a fever for 24 hours without taking Tylenol or ibuprofen.  Use over-the-counter Tylenol and ibuprofen as needed for pain and/or fever.  Gargle with warm salt water 2-3 times a day to soothe your throat and aid in pain relief.  If you develop any shortness of breath, especially at rest, you cannot speak full sentences, or you develop bluing around your lips you to go the ER for evaluation.

## 2020-03-22 NOTE — ED Triage Notes (Signed)
Patient complains of sore throat with throat swelling and painful swallowing x this morning. Patient states that she also has a headache and nasal congestion.

## 2020-03-23 LAB — SARS CORONAVIRUS 2 (TAT 6-24 HRS): SARS Coronavirus 2: NEGATIVE

## 2021-01-23 ENCOUNTER — Ambulatory Visit
Admission: EM | Admit: 2021-01-23 | Discharge: 2021-01-23 | Disposition: A | Discharge status: Home / Self Care | Payer: 59 | Attending: Internal Medicine | Admitting: Internal Medicine

## 2021-01-23 DIAGNOSIS — R051 Acute cough: Secondary | ICD-10-CM | POA: Diagnosis present

## 2021-01-23 DIAGNOSIS — B349 Viral infection, unspecified: Secondary | ICD-10-CM | POA: Insufficient documentation

## 2021-01-23 DIAGNOSIS — J029 Acute pharyngitis, unspecified: Secondary | ICD-10-CM | POA: Diagnosis present

## 2021-01-23 DIAGNOSIS — Z20822 Contact with and (suspected) exposure to covid-19: Secondary | ICD-10-CM | POA: Diagnosis present

## 2021-01-23 MED ORDER — PSEUDOEPH-BROMPHEN-DM 30-2-10 MG/5ML PO SYRP
10.0000 mL | ORAL_SOLUTION | Freq: Four times a day (QID) | ORAL | 0 refills | Status: AC | PRN
Start: 2021-01-23 — End: 2021-01-30

## 2021-01-23 NOTE — ED Triage Notes (Signed)
Patient presents to Urgent Care with complaints of sore throat, congestion, and headache since Saturday. Patient reports mom tested positive for covid. Treating symptoms with Vicks.  Denies fever.

## 2021-01-23 NOTE — Discharge Instructions (Signed)

## 2021-01-23 NOTE — ED Provider Notes (Signed)
[facility]    CSN: [id number] Arrival date & time: 01/23/21  1047      History   Chief Complaint Chief Complaint  Patient presents with   Sore Throat   Headache   Nasal Congestion    HPI Kristen Charles is a 25 y.o. female presenting for 2-day history of cough, congestion, headache and sore throat.  Patient denies any fevers.  Has been fatigued but denies aches.  No chest pain or breathing difficulty.  No vomiting or diarrhea.  Her mother tested positive for COVID-19 yesterday.  She was around her mother this past weekend.  Patient has been using Vicks VapoRub for symptoms.  She is otherwise healthy.  She has no other complaints.  HPI  Past Medical History:  Diagnosis Date   ADD (attention deficit disorder)    BV (bacterial vaginosis)    Recurrent   Obesity     Patient Active Problem List   Diagnosis Date Noted   Laceration of left index finger without damage to nail 08/25/2016   Need for prophylactic vaccination with combined diphtheria-tetanus-pertussis (DTP) vaccine 08/25/2016    Past Surgical History:  Procedure Laterality Date   NO PAST SURGERIES      OB History   No obstetric history on file.      Home Medications    Prior to Admission medications   Medication Sig Start Date End Date Taking? Authorizing Provider  brompheniramine-pseudoephedrine-DM 30-2-10 MG/5ML syrup Take 10 mLs by mouth 4 (four) times daily as needed for up to 7 days. 01/23/21 01/30/21 Yes Shirlee Latch, PA-C  albuterol (PROAIR HFA) 108 (90 Base) MCG/ACT inhaler Inhale 1-2 puffs into the lungs every 6 (six) hours as needed for wheezing or shortness of breath. 10/22/19 03/22/20  Georgetta Haber, NP    Family History Family History  Problem Relation Age of Onset   Healthy Mother     Social History Social History   Tobacco Use   Smoking status: Never   Smokeless tobacco: Never  Vaping Use   Vaping Use: Every day  Substance Use Topics   Alcohol use: No   Drug  use: No     Allergies   Patient has no known allergies.   Review of Systems Review of Systems  Constitutional:  Positive for fatigue. Negative for chills, diaphoresis and fever.  HENT:  Positive for congestion, rhinorrhea and sore throat. Negative for ear pain, sinus pressure and sinus pain.   Respiratory:  Positive for cough. Negative for shortness of breath.   Cardiovascular:  Negative for chest pain.  Gastrointestinal:  Negative for abdominal pain, nausea and vomiting.  Musculoskeletal:  Negative for arthralgias and myalgias.  Skin:  Negative for rash.  Neurological:  Negative for weakness and headaches.  Hematological:  Negative for adenopathy.    Physical Exam Triage Vital Signs ED Triage Vitals  Enc Vitals Group     BP 01/23/21 1222 111/60     Pulse Rate 01/23/21 1222 73     Resp 01/23/21 1222 16     Temp 01/23/21 1222 98.2 F (36.8 C)     Temp Source 01/23/21 1222 Oral     SpO2 01/23/21 1222 99 %     Weight --      Height --      Head Circumference --      Peak Flow --      Pain Score 01/23/21 1221 0     Pain Loc --      Pain  Edu? --      Excl. in [place]? --    No data found.  Updated Vital Signs BP 111/60 (BP Location: Left Arm)   Pulse 73   Temp 98.2 F (36.8 C) (Oral)   Resp 16   LMP 01/16/2021 (Approximate)   SpO2 99%     Physical Exam Vitals and nursing note reviewed.  Constitutional:      General: She is not in acute distress.    Appearance: Normal appearance. She is well-developed. She is ill-appearing. She is not toxic-appearing.  HENT:     Head: Normocephalic and atraumatic.     Nose: Congestion and rhinorrhea present.     Mouth/Throat:     Mouth: Mucous membranes are moist.     Pharynx: Oropharynx is clear. Posterior oropharyngeal erythema present.  Eyes:     General: No scleral icterus.       Right eye: No discharge.        Left eye: No discharge.     Conjunctiva/sclera: Conjunctivae normal.  Cardiovascular:     Rate and Rhythm:  Normal rate and regular rhythm.     Heart sounds: Normal heart sounds.  Pulmonary:     Effort: Pulmonary effort is normal. No respiratory distress.     Breath sounds: Normal breath sounds.  Musculoskeletal:     Cervical back: Neck supple.  Skin:    General: Skin is dry.  Neurological:     General: No focal deficit present.     Mental Status: She is alert. Mental status is at baseline.     Motor: No weakness.     Gait: Gait normal.  Psychiatric:        Mood and Affect: Mood normal.        Behavior: Behavior normal.        Thought Content: Thought content normal.     UC Treatments / Results  Labs (all labs ordered are listed, but only abnormal results are displayed) Labs Reviewed  SARS CORONAVIRUS 2 (TAT 6-24 HRS)    EKG   Radiology No results found.  Procedures Procedures (including critical care time)  Medications Ordered in UC Medications - No data to display  Initial Impression / Assessment and Plan / UC Course  I have reviewed the triage vital signs and the nursing notes.  Pertinent labs & imaging results that were available during my care of the patient were reviewed by me and considered in my medical decision making (see chart for details).  25 year old female presenting for 2-day history of cough, congestion, sore throat, headache and fatigue.  Exposed to COVID-19.  Vitals stable.  Patient is ill-appearing but nontoxic.  Exam severe for nasal congestion and rhinorrhea as well as posterior pharyngeal erythema.  Chest clear to auscultation heart regular rate rhythm.  PCR COVID test obtained.  Current CDC guidelines, isolation protocol and ED precautions reviewed positive for COVID-19.  Advised patient given her symptoms and exposure to COVID-19 I do suspect that she will test positive.  I have advised supportive care with increasing rest and fluids.  Sent Bromfed-DM to pharmacy.  Work note given.  Follow-up here as needed.   Final Clinical Impressions(s) / UC  Diagnoses   Final diagnoses:  Viral illness  Sore throat  Acute cough  Exposure to COVID-19 virus     Discharge Instructions      You have received COVID testing today either for positive exposure, concerning symptoms that could be related to COVID infection, screening purposes, or re-testing  after confirmed positive.  Your test obtained today checks for active viral infection in the last 1-2 weeks. If your test is negative now, you can still test positive later. So, if you do develop symptoms you should either get re-tested and/or isolate x 5 days and then strict mask use x 5 days (unvaccinated) or mask use x 10 days (vaccinated). Please follow CDC guidelines.  While Rapid antigen tests come back in 15-20 minutes, send out PCR/molecular test results typically come back within 1-3 days. In the mean time, if you are symptomatic, assume this could be a positive test and treat/monitor yourself as if you do have COVID.   We will call with test results if positive. Please download the MyChart app and set up a profile to access test results.   If symptomatic, go home and rest. Push fluids. Take Tylenol as needed for discomfort. Gargle warm salt water. Throat lozenges. Take Mucinex DM or Robitussin for cough. Humidifier in bedroom to ease coughing. Warm showers. Also review the COVID handout for more information.  COVID-19 INFECTION: The incubation period of COVID-19 is approximately 14 days after exposure, with most symptoms developing in roughly 4-5 days. Symptoms may range in severity from mild to critically severe. Roughly 80% of those infected will have mild symptoms. People of any age may become infected with COVID-19 and have the ability to transmit the virus. The most common symptoms include: fever, fatigue, cough, body aches, headaches, sore throat, nasal congestion, shortness of breath, nausea, vomiting, diarrhea, changes in smell and/or taste.    COURSE OF ILLNESS Some patients may begin  with mild disease which can progress quickly into critical symptoms. If your symptoms are worsening please call ahead to the Emergency Department and proceed there for further treatment. Recovery time appears to be roughly 1-2 weeks for mild symptoms and 3-6 weeks for severe disease.   GO IMMEDIATELY TO ER FOR FEVER YOU ARE UNABLE TO GET DOWN WITH TYLENOL, BREATHING PROBLEMS, CHEST PAIN, FATIGUE, LETHARGY, INABILITY TO EAT OR DRINK, ETC  QUARANTINE AND ISOLATION: To help decrease the spread of COVID-19 please remain isolated if you have COVID infection or are highly suspected to have COVID infection. This means -stay home and isolate to one room in the home if you live with others. Do not share a bed or bathroom with others while ill, sanitize and wipe down all countertops and keep common areas clean and disinfected. Stay home for 5 days. If you have no symptoms or your symptoms are resolving after 5 days, you can leave your house. Continue to wear a mask around others for 5 additional days. If you have been in close contact (within 6 feet) of someone diagnosed with COVID 19, you are advised to quarantine in your home for 14 days as symptoms can develop anywhere from 2-14 days after exposure to the virus. If you develop symptoms, you  must isolate.  Most current guidelines for COVID after exposure -unvaccinated: isolate 5 days and strict mask use x 5 days. Test on day 5 is possible -vaccinated: wear mask x 10 days if symptoms do not develop -You do not necessarily need to be tested for COVID if you have + exposure and  develop symptoms. Just isolate at home x10 days from symptom onset During this global pandemic, CDC advises to practice social distancing, try to stay at least 39ft away from others at all times. Wear a face covering. Wash and sanitize your hands regularly and avoid going anywhere that is  not necessary.  KEEP IN MIND THAT THE COVID TEST IS NOT 100% ACCURATE AND YOU SHOULD STILL DO  EVERYTHING TO PREVENT POTENTIAL SPREAD OF VIRUS TO OTHERS (WEAR MASK, WEAR GLOVES, WASH HANDS AND SANITIZE REGULARLY). IF INITIAL TEST IS NEGATIVE, THIS MAY NOT MEAN YOU ARE DEFINITELY NEGATIVE. MOST ACCURATE TESTING IS DONE 5-7 DAYS AFTER EXPOSURE.   It is not advised by CDC to get re-tested after receiving a positive COVID test since you can still test positive for weeks to months after you have already cleared the virus.   *If you have not been vaccinated for COVID, I strongly suggest you consider getting vaccinated as long as there are no contraindications.       ED Prescriptions     Medication Sig Dispense Auth. Provider   brompheniramine-pseudoephedrine-DM 30-2-10 MG/5ML syrup Take 10 mLs by mouth 4 (four) times daily as needed for up to 7 days. 150 mL Shirlee Latch, PA-C      PDMP not reviewed this encounter.   Shirlee Latch, PA-C 01/23/21 1300

## 2021-01-24 LAB — SARS CORONAVIRUS 2 (TAT 6-24 HRS): SARS Coronavirus 2: POSITIVE — AB

## 2021-03-26 ENCOUNTER — Ambulatory Visit
Admission: EM | Admit: 2021-03-26 | Discharge: 2021-03-26 | Disposition: A | Discharge status: Home / Self Care | Payer: 59 | Attending: Emergency Medicine | Admitting: Emergency Medicine

## 2021-03-26 DIAGNOSIS — B3731 Acute candidiasis of vulva and vagina: Secondary | ICD-10-CM | POA: Insufficient documentation

## 2021-03-26 DIAGNOSIS — N760 Acute vaginitis: Secondary | ICD-10-CM | POA: Diagnosis present

## 2021-03-26 DIAGNOSIS — B9689 Other specified bacterial agents as the cause of diseases classified elsewhere: Secondary | ICD-10-CM | POA: Insufficient documentation

## 2021-03-26 LAB — WET PREP, GENITAL
Sperm: NONE SEEN
Trich, Wet Prep: NONE SEEN
WBC, Wet Prep HPF POC: >10 — AB (ref <10)

## 2021-03-26 MED ORDER — FLUCONAZOLE 200 MG PO TABS: 200.0000 mg | ORAL_TABLET | Freq: Every day | ORAL | 1 refills | Status: AC

## 2021-03-26 MED ORDER — METRONIDAZOLE 500 MG PO TABS: 500.0000 mg | ORAL_TABLET | Freq: Two times a day (BID) | ORAL | 0 refills | Status: DC

## 2021-03-26 NOTE — Discharge Instructions (Signed)
Take the Flagyl (metronidazole) 500 mg twice daily for treatment of your bacterial vaginosis.  Avoid alcohol while on the metronidazole as taken together will cause of vomiting.  Bacterial vaginosis is often caused by a imbalance of bacteria in your vaginal vault.  This is sometimes a result of using tampons or hormonal fluctuations during her menstrual cycle.  You if your symptoms are recurrent you can try using a boric acid suppository twice weekly to help maintain the acid-base balance in your vagina vault which could prevent further infection.  You can also try vaginal probiotics to help return normal bacterial balance.   For your vaginal yeast infection take one diflucan tablet now and repeat in 1 week.

## 2021-03-26 NOTE — ED Triage Notes (Signed)
Pt here with C/O itching for 3 days. Is having some discharge thick and yellow.

## 2021-03-26 NOTE — ED Provider Notes (Signed)
[facility]    CSN: [id number] Arrival date & time: 03/26/21  1446      History   Chief Complaint Chief Complaint  Patient presents with   Vaginal Itching    HPI CLARISSE DEGENNARO is a 26 y.o. female.   HPI  65 old female here for evaluation of GYN complaints. Patient reports that for the last 3 days she has been experiencing vaginal itching with a thick yellow vaginal discharge.  She states that discharge does not have an odor.  She states she is not concerned about sexually transmitted infections because she is monogamous with a long-term partner.  She also denies painful urination, urinary urgency or frequency, low back pain, abdominal pain, nausea, or vomiting.   Past Medical History:  Diagnosis Date   ADD (attention deficit disorder)    BV (bacterial vaginosis)    Recurrent   Obesity     Patient Active Problem List   Diagnosis Date Noted   Laceration of left index finger without damage to nail 08/25/2016   Need for prophylactic vaccination with combined diphtheria-tetanus-pertussis (DTP) vaccine 08/25/2016    Past Surgical History:  Procedure Laterality Date   NO PAST SURGERIES      OB History   No obstetric history on file.      Home Medications    Prior to Admission medications   Medication Sig Start Date End Date Taking? Authorizing Provider  fluconazole (DIFLUCAN) 200 MG tablet Take 1 tablet (200 mg total) by mouth daily for 2 doses. 03/26/21 03/28/21 Yes Margarette Canada, NP  metroNIDAZOLE (FLAGYL) 500 MG tablet Take 1 tablet (500 mg total) by mouth 2 (two) times daily. 03/26/21  Yes Margarette Canada, NP  albuterol [facility] HFA) 108 (90 Base) MCG/ACT inhaler Inhale 1-2 puffs into the lungs every 6 (six) hours as needed for wheezing or shortness of breath. 10/22/19 03/22/20  Zigmund Gottron, NP    Family History Family History  Problem Relation Age of Onset   Healthy Mother     Social History Social History   Tobacco Use   Smoking status: Never    Smokeless tobacco: Never  Vaping Use   Vaping Use: Every day  Substance Use Topics   Alcohol use: No   Drug use: No     Allergies   Patient has no known allergies.   Review of Systems Review of Systems  Constitutional:  Negative for fever.  Gastrointestinal:  Negative for nausea and vomiting.  Genitourinary:  Positive for vaginal discharge and vaginal pain. Negative for dysuria, frequency, hematuria and urgency.  Musculoskeletal:  Negative for back pain.  Skin:  Negative for rash.  Hematological: Negative.   Psychiatric/Behavioral: Negative.      Physical Exam Triage Vital Signs ED Triage Vitals  Enc Vitals Group     BP 03/26/21 1548 128/77     Pulse Rate 03/26/21 1548 75     Resp 03/26/21 1548 18     Temp 03/26/21 1548 99.2 F (37.3 C)     Temp Source 03/26/21 1548 Oral     SpO2 03/26/21 1548 98 %     Weight 03/26/21 1547 230 lb (104.3 kg)     Height 03/26/21 1547 5\' 6"  (1.676 m)     Head Circumference --      Peak Flow --      Pain Score 03/26/21 1547 0     Pain Loc --      Pain Edu? --      Excl.  in [place]? --    No data found.  Updated Vital Signs BP 128/77 (BP Location: Right Arm)    Pulse 75    Temp 99.2 F (37.3 C) (Oral)    Resp 18    Ht 5\' 6"  (1.676 m)    Wt 230 lb (104.3 kg)    LMP 02/26/2021    SpO2 98%    BMI 37.12 kg/m   Visual Acuity Right Eye Distance:   Left Eye Distance:   Bilateral Distance:    Right Eye Near:   Left Eye Near:    Bilateral Near:     Physical Exam Vitals and nursing note reviewed.  Constitutional:      General: She is not in acute distress.    Appearance: Normal appearance. She is not ill-appearing.  HENT:     Head: Normocephalic and atraumatic.  Cardiovascular:     Rate and Rhythm: Normal rate and regular rhythm.     Pulses: Normal pulses.     Heart sounds: Normal heart sounds. No murmur heard.   No friction rub. No gallop.  Pulmonary:     Effort: Pulmonary effort is normal.     Breath sounds: Normal breath  sounds. No wheezing, rhonchi or rales.  Abdominal:     Tenderness: There is no abdominal tenderness. There is no right CVA tenderness or left CVA tenderness.  Skin:    General: Skin is warm and dry.     Capillary Refill: Capillary refill takes less than 2 seconds.     Findings: No erythema or rash.  Neurological:     General: No focal deficit present.     Mental Status: She is alert and oriented to person, place, and time.  Psychiatric:        Mood and Affect: Mood normal.        Behavior: Behavior normal.        Thought Content: Thought content normal.        Judgment: Judgment normal.     UC Treatments / Results  Labs (all labs ordered are listed, but only abnormal results are displayed) Labs Reviewed  WET PREP, GENITAL - Abnormal; Notable for the following components:      Result Value   Yeast Wet Prep HPF POC PRESENT (*)    Clue Cells Wet Prep HPF POC PRESENT (*)    WBC, Wet Prep HPF POC >10 (*)    All other components within normal limits    EKG   Radiology No results found.  Procedures Procedures (including critical care time)  Medications Ordered in UC Medications - No data to display  Initial Impression / Assessment and Plan / UC Course  I have reviewed the triage vital signs and the nursing notes.  Pertinent labs & imaging results that were available during my care of the patient were reviewed by me and considered in my medical decision making (see chart for details).  Patient is a nontoxic-appearing 87 old female here for evaluation of GYN complaints as outlined in HPI above.  Her physical exam reveals a benign cardiopulmonary exam with clear lung sounds in all fields.  No CVA tenderness on exam.  Abdomen is soft and nontender.  Wet prep was collected at triage which is positive for bacterial vaginosis and yeast.  She denies any concerns for STIs and she is with a long-term monogamous partner.  We will treat the BV with metronidazole and the vaginal yeast  infection with Diflucan.  I will have her take  the metronidazole twice daily for 7 days and the Diflucan 1 tablet now and repeat in 7 days after she is finished the Flagyl.  Work note provided.   Final Clinical Impressions(s) / UC Diagnoses   Final diagnoses:  BV (bacterial vaginosis)  Vaginal yeast infection     Discharge Instructions      Take the Flagyl (metronidazole) 500 mg twice daily for treatment of your bacterial vaginosis.  Avoid alcohol while on the metronidazole as taken together will cause of vomiting.  Bacterial vaginosis is often caused by a imbalance of bacteria in your vaginal vault.  This is sometimes a result of using tampons or hormonal fluctuations during her menstrual cycle.  You if your symptoms are recurrent you can try using a boric acid suppository twice weekly to help maintain the acid-base balance in your vagina vault which could prevent further infection.  You can also try vaginal probiotics to help return normal bacterial balance.   For your vaginal yeast infection take one diflucan tablet now and repeat in 1 week.      ED Prescriptions     Medication Sig Dispense Auth. Provider   metroNIDAZOLE (FLAGYL) 500 MG tablet Take 1 tablet (500 mg total) by mouth 2 (two) times daily. 14 tablet Margarette Canada, NP   fluconazole (DIFLUCAN) 200 MG tablet Take 1 tablet (200 mg total) by mouth daily for 2 doses. 2 tablet Margarette Canada, NP      PDMP not reviewed this encounter.   Margarette Canada, NP 03/26/21 [phone]

## 2021-04-19 ENCOUNTER — Other Ambulatory Visit: Payer: Self-pay

## 2021-04-19 ENCOUNTER — Ambulatory Visit
Admission: EM | Admit: 2021-04-19 | Discharge: 2021-04-19 | Disposition: A | Discharge status: Home / Self Care | Payer: 59 | Attending: Emergency Medicine | Admitting: Emergency Medicine

## 2021-04-19 ENCOUNTER — Ambulatory Visit: Admit: 2021-04-19 | Payer: 59

## 2021-04-19 DIAGNOSIS — R110 Nausea: Secondary | ICD-10-CM | POA: Diagnosis present

## 2021-04-19 LAB — PREGNANCY, URINE: Preg Test, Ur: NEGATIVE

## 2021-04-19 MED ORDER — ONDANSETRON 8 MG PO TBDP: ORAL_TABLET | ORAL | 0 refills | Status: DC

## 2021-04-19 NOTE — ED Triage Notes (Signed)
Pt present nausea on the stomach with no other symptoms. Symptom started yesterday after she ate a sub at work.

## 2021-04-19 NOTE — Discharge Instructions (Addendum)
Take Zofran as needed for nausea.  It may make you constipated.  Push electrolyte containing fluids, such as Pedialyte and Gatorade and start with a bland diet.

## 2021-04-19 NOTE — ED Provider Notes (Signed)
HPI  SUBJECTIVE:  Kristen Charles is a 26 y.o. female who presents with constant nausea and decreased appetite starting yesterday after eating a [employer] sandwich, muffin, chips and some water.  She states that she felt lightheaded, but this has since resolved.  No fevers, vomiting, diarrhea, abdominal pain, headache, chest pain, shortness of breath, palpitations, syncope.  No body aches, headaches, nasal congestion, rhinorrhea, cough, shortness of breath.  She does not take any medications on a regular basis.  No excess ibuprofen, Tylenol, coffee use.  She has not tried anything for this.  Symptoms are better with fasting, worse with movement and eating.  No contacts with similar symptoms.  No COVID or flu exposure.  She got the flu vaccine and has had 2 doses of the COVID-vaccine.  She had a soft bowel movement this morning which did not change her symptoms.  She denies diarrhea.  She has a past medical history of nephrolithiasis.  LMP: 1/15.  PMD: [facility]    Past Medical History:  Diagnosis Date   ADD (attention deficit disorder)    BV (bacterial vaginosis)    Recurrent   Obesity     Past Surgical History:  Procedure Laterality Date   NO PAST SURGERIES      Family History  Problem Relation Age of Onset   Healthy Mother     Social History   Tobacco Use   Smoking status: Never   Smokeless tobacco: Never  Vaping Use   Vaping Use: Every day  Substance Use Topics   Alcohol use: No   Drug use: No    No current facility-administered medications for this encounter.  Current Outpatient Medications:    ondansetron (ZOFRAN-ODT) 8 MG disintegrating tablet, 1/2- 1 tablet q 8 hr prn nausea, vomiting, Disp: 20 tablet, Rfl: 0  No Known Allergies   ROS  As noted in HPI.   Physical Exam  BP 125/80 (BP Location: Left Arm)    Pulse 74    Temp 98.6 F (37 C) (Oral)    Resp 16    LMP 04/05/2021    SpO2 100%   Constitutional: Well developed, well nourished, no  acute distress Eyes:  EOMI, conjunctiva normal bilaterally HENT: Normocephalic, atraumatic,mucus membranes moist Respiratory: Normal inspiratory effort, lungs clear bilaterally Cardiovascular: Normal rate, regular rhythm, no murmurs, rubs, GI: nondistended, soft, nontender, bowel sounds present, no rebound, guarding. Back: No CVAT skin: No rash, skin intact Musculoskeletal: no deformities Neurologic: Alert & oriented x 3, no focal neuro deficits Psychiatric: Speech and behavior appropriate   ED Course   Medications - No data to display  Orders Placed This Encounter  Procedures   Pregnancy, urine    Standing Status:   Standing    Number of Occurrences:   1    Results for orders placed or performed during the hospital encounter of 04/19/21 (from the past 24 hour(s))  Pregnancy, urine     Status: None   Collection Time: 04/19/21 12:43 PM  Result Value Ref Range   Preg Test, Ur NEGATIVE NEGATIVE   No results found.  ED Clinical Impression  1. Nausea      ED Assessment/Plan  Urine pregnancy negative.  Patient's abdomen is benign, her vitals are normal.  I am unsure as to the cause of her symptoms, but I suspect it had something to do with the lunch that she ate yesterday.  Home with Zofran, push electrolyte containing fluids, bland diet.  Work note for today.  Follow-up  with PMD as needed.  May return here as well.  Discussed when to seek medical attention.  Discussed labs, MDM, treatment plan, and plan for follow-up with patient. patient agrees with plan.   Meds ordered this encounter  Medications   ondansetron (ZOFRAN-ODT) 8 MG disintegrating tablet    Sig: 1/2- 1 tablet q 8 hr prn nausea, vomiting    Dispense:  20 tablet    Refill:  0      *This clinic note was created using Scientist, clinical (histocompatibility and immunogenetics). Therefore, there may be occasional mistakes despite careful proofreading.  ?    Domenick Gong, MD 04/19/21 1345

## 2021-05-17 ENCOUNTER — Ambulatory Visit
Admission: EM | Admit: 2021-05-17 | Discharge: 2021-05-17 | Disposition: A | Discharge status: Home / Self Care | Payer: 59 | Attending: Emergency Medicine | Admitting: Emergency Medicine

## 2021-05-17 ENCOUNTER — Ambulatory Visit (INDEPENDENT_AMBULATORY_CARE_PROVIDER_SITE_OTHER): Payer: 59

## 2021-05-17 DIAGNOSIS — M25572 Pain in left ankle and joints of left foot: Secondary | ICD-10-CM

## 2021-05-17 DIAGNOSIS — S93492A Sprain of other ligament of left ankle, initial encounter: Secondary | ICD-10-CM

## 2021-05-17 MED ORDER — IBUPROFEN 600 MG PO TABS: 600.0000 mg | ORAL_TABLET | Freq: Four times a day (QID) | ORAL | 0 refills | Status: DC | PRN

## 2021-05-17 NOTE — Discharge Instructions (Addendum)
Ice, elevate of your heart as much as possible over the next 72 hours.  Wear the ASO at all times for the first few weeks, then as needed for comfort.  Keep the ASO in case you have sprained your ankle again.  Follow-up with Dr. Nikyla Navedo Royalty if not better in 10 to 14 days.  You may need physical therapy. ?

## 2021-05-17 NOTE — ED Triage Notes (Signed)
Patient is here for "Left Ankle Pain". "With father, up against a wall, then slid down intentionally, hurt right hip, then left ankle". Hip still hurts some, Ankle more. Ankle "twisted in". No laceration. No abrasions.  ?

## 2021-05-17 NOTE — ED Provider Notes (Signed)
HPI ? ?SUBJECTIVE: ? ?Kristen Charles is a 26 y.o. female who presents with left lateral ankle pain located behind her distal fibula after a sitting down hard with her legs crossed, directly onto her ankle.  She describes the pain as sharp, present with weightbearing, ankle inversion/eversion.  She was able to bear weight immediately after.  No swelling, erythema, bruising, distal numbness or tingling.  She tried ibuprofen without improvement in her symptoms.  Symptoms are worse with weightbearing, inversion/eversion.  She thinks that she may have injured this ankle before.  LMP: February 13.  Denies possibility of being pregnant.  PCP: In [place] ? ? ? ?Past Medical History:  ?Diagnosis Date  ? ADD (attention deficit disorder)   ? BV (bacterial vaginosis)   ? Recurrent  ? Obesity   ? ? ?Past Surgical History:  ?Procedure Laterality Date  ? NO PAST SURGERIES    ? ? ?Family History  ?Problem Relation Age of Onset  ? Healthy Mother   ? ? ?Social History  ? ?Tobacco Use  ? Smoking status: Never  ? Smokeless tobacco: Never  ?Vaping Use  ? Vaping Use: Every day  ?Substance Use Topics  ? Alcohol use: No  ? Drug use: No  ? ? ?No current facility-administered medications for this encounter. ? ?Current Outpatient Medications:  ?  ibuprofen (ADVIL) 600 MG tablet, Take 1 tablet (600 mg total) by mouth every 6 (six) hours as needed., Disp: 30 tablet, Rfl: 0 ? ?No Known Allergies ? ? ?ROS ? ?As noted in HPI.  ? ?Physical Exam ? ?BP 121/77 (BP Location: Left Arm)   Pulse 84   Temp 98.1 ?F (36.7 ?C) (Oral)   Resp 20   Ht 5\' 6"  (1.676 m)   Wt 108.9 kg   LMP 04/30/2021 (Exact Date)   SpO2 100%   BMI 38.74 kg/m?  ? ?Constitutional: Well developed, well nourished, no acute distress ?Eyes:  EOMI, conjunctiva normal bilaterally ?HENT: Normocephalic, atraumatic,mucus membranes moist ?Respiratory: Normal inspiratory effort ?Cardiovascular: Normal rate ?GI: nondistended ?skin: No rash, skin intact ?Musculoskeletal:  L Ankle  Proximal fibula NT , Distal fibula tender, Medial malleolus NT,  Deltoid ligaments medially NT,  ATFL tender, calcaneofibular ligament tender, posterior tablofibular ligament  NT ,  Achilles NT, calcaneus NT,  Proximal 5th metatarsal NT, Midfoot NT, distal NVI with baseline sensation / motor to foot with DP 2+. no pain with dorsiflexion/plantar flexion. Pain  with inversion/eversion. - bruising. - squeeze test, Ant drawer test stable. Pt not able to bear weight in dept.   ?Neurologic: Alert & oriented x 3, no focal neuro deficits ?Psychiatric: Speech and behavior appropriate ? ? ?ED Course ? ? ?Medications - No data to display ? ?Orders Placed This Encounter  ?Procedures  ? DG Ankle Complete Left  ?  Standing Status:   Standing  ?  Number of Occurrences:   1  ?  Order Specific Question:   Reason for Exam (SYMPTOM  OR DIAGNOSIS REQUIRED)  ?  Answer:   Pain. Injury.  ?  Order Specific Question:   Release to patient  ?  Answer:   Immediate  ? Apply ASO ankle  ?  Standing Status:   Standing  ?  Number of Occurrences:   1  ?  Order Specific Question:   Laterality  ?  Answer:   Left  ? ? ?No results found for this or any previous visit (from the past 24 hour(s)). ?DG Ankle Complete Left ? ?Result Date: 05/17/2021 ?  CLINICAL DATA:  Pole left ankle pain twisting injury. EXAM: LEFT ANKLE COMPLETE - 3+ VIEW COMPARISON:  None. FINDINGS: There is no evidence of fracture, dislocation, or joint effusion. There is no evidence of arthropathy or other focal bone abnormality. Soft tissues are unremarkable. IMPRESSION: Negative. Electronically Signed   By: Paulina Fusi M.D.   On: 05/17/2021 19:29   ? ?ED Clinical Impression ? ?1. Sprain of posterior talofibular ligament of left ankle, initial encounter   ?  ? ?ED Assessment/Plan ? ?Reviewed imaging independently.  Normal ankle.  see radiology report for full details. ? ?X-ray negative for fracture.  Patient with a left ankle sprain.  Placing in ASO, home with Tylenol/ibuprofen, ice,  elevation.  Work note for tomorrow.  She does not work the weekend, and will go back on Monday.  Follow-up with Dr. Joseph Berkshire, sports medicine if not better in 10 to 14 days. ? ?Discussed imaging, MDM, treatment plan, and plan for follow-up with patient. patient agrees with plan.  ? ?Meds ordered this encounter  ?Medications  ? ibuprofen (ADVIL) 600 MG tablet  ?  Sig: Take 1 tablet (600 mg total) by mouth every 6 (six) hours as needed.  ?  Dispense:  30 tablet  ?  Refill:  0  ? ? ? ? ?*This clinic note was created using Scientist, clinical (histocompatibility and immunogenetics). Therefore, there may be occasional mistakes despite careful proofreading. ? ?? ? ?  ?Domenick Gong, MD ?05/18/21 0825 ? ?

## 2021-08-20 ENCOUNTER — Other Ambulatory Visit: Payer: Self-pay

## 2021-08-20 ENCOUNTER — Emergency Department
Admission: EM | Admit: 2021-08-20 | Discharge: 2021-08-20 | Disposition: A | Discharge status: Home / Self Care | Payer: 59 | Attending: Student in an Organized Health Care Education/Training Program | Admitting: Student in an Organized Health Care Education/Training Program

## 2021-08-20 DIAGNOSIS — M545 Low back pain, unspecified: Secondary | ICD-10-CM | POA: Insufficient documentation

## 2021-08-20 LAB — URINALYSIS, ROUTINE W REFLEX MICROSCOPIC
Bilirubin Urine: NEGATIVE
Glucose, UA: NEGATIVE mg/dL
Hgb urine dipstick: NEGATIVE
Ketones, ur: 5 mg/dL — AB
Leukocytes,Ua: NEGATIVE
Nitrite: NEGATIVE
Protein, ur: NEGATIVE mg/dL
Specific Gravity, Urine: 1.017 (ref 1.005–1.030)
pH: 6 (ref 5.0–8.0)

## 2021-08-20 LAB — POC URINE PREG, ED: Preg Test, Ur: NEGATIVE

## 2021-08-20 MED ORDER — KETOROLAC TROMETHAMINE 30 MG/ML IJ SOLN
30.0000 mg | Freq: Once | INTRAMUSCULAR | Status: AC
  Administered 2021-08-20: 30 mg via INTRAMUSCULAR
  Filled 2021-08-20: qty 1

## 2021-08-20 MED ORDER — MELOXICAM 15 MG PO TABS
15.0000 mg | ORAL_TABLET | Freq: Every day | ORAL | 1 refills | Status: AC
Start: 2021-08-20 — End: 2021-08-27

## 2021-08-20 NOTE — Discharge Instructions (Signed)
Take meloxicam once daily for pain and inflammation. 

## 2021-08-20 NOTE — ED Provider Notes (Signed)
[facility] Provider Note  Patient Contact: 3:14 PM (approximate)   History   Back Pain   HPI  Kristen Charles is a 26 y.o. female presents to the emergency department with bilateral low back pain for the past 3 days.  Patient describes pain as aching and nonradiating without associated fever, nausea or vomiting.  Patient states that she has had similar pain only one other time in her life when she was diagnosed with a kidney stone.  She is unsure about possibility of pregnancy.  No chest pain, chest tightness or abdominal pain.  No falls or recent mechanisms of trauma.      Physical Exam   Triage Vital Signs: ED Triage Vitals  Enc Vitals Group     BP 08/20/21 1358 (!) 120/49     Pulse Rate 08/20/21 1358 93     Resp 08/20/21 1358 18     Temp 08/20/21 1358 98 F (36.7 C)     Temp src --      SpO2 08/20/21 1358 100 %     Weight 08/20/21 1502 240 lb 1.3 oz (108.9 kg)     Height 08/20/21 1502 5\' 6"  (1.676 m)     Head Circumference --      Peak Flow --      Pain Score 08/20/21 1357 9     Pain Loc --      Pain Edu? --      Excl. in [place]? --     Most recent vital signs: Vitals:   08/20/21 1358  BP: (!) 120/49  Pulse: 93  Resp: 18  Temp: 98 F (36.7 C)  SpO2: 100%     General: Alert and in no acute distress. Eyes:  PERRL. EOMI. Head: No acute traumatic findings ENT:       Ears: Tms are pearly.       Nose: No congestion/rhinnorhea.      Mouth/Throat: Mucous membranes are moist.  Neck: No stridor. No cervical spine tenderness to palpation. Cardiovascular:  Good peripheral perfusion Respiratory: Normal respiratory effort without tachypnea or retractions. Lungs CTAB. Good air entry to the bases with no decreased or absent breath sounds. Gastrointestinal: Bowel sounds 4 quadrants. Soft and nontender to palpation. No guarding or rigidity. No palpable masses. No distention. No CVA tenderness. Musculoskeletal: Full range of motion to all  extremities.  Neurologic:  No gross focal neurologic deficits are appreciated.  Skin:   No rash noted Other:   ED Results / Procedures / Treatments   Labs (all labs ordered are listed, but only abnormal results are displayed) Labs Reviewed  URINALYSIS, ROUTINE W REFLEX MICROSCOPIC - Abnormal; Notable for the following components:      Result Value   Color, Urine YELLOW (*)    APPearance HAZY (*)    Ketones, ur 5 (*)    All other components within normal limits  POC URINE PREG, ED        PROCEDURES:  Critical Care performed: No  Procedures   MEDICATIONS ORDERED IN ED: Medications  ketorolac (TORADOL) 30 MG/ML injection 30 mg (has no administration in time range)     IMPRESSION / MDM / ASSESSMENT AND PLAN / ED COURSE  I reviewed the triage vital signs and the nursing notes.                              Assessment and plan Differential includes urinary tract infection,  nephrolithiasis, pyelonephritis 26 year old female presents to the emergency department with concern for bilateral nonradiating low back pain without dysuria or hematuria.  Vital signs are reassuring at triage.  On exam, patient was alert, active and not toxic appearing.  Urinalysis showed no signs of UTI or hematuria to suggest nephrolithiasis.  Upon recheck, patient seemed to be resting comfortably.  Low suspicion for nephrolithiasis at this time.  We will treat with meloxicam and injection of Toradol prior to discharge.  Urine pregnancy test was negative.   FINAL CLINICAL IMPRESSION(S) / ED DIAGNOSES   Final diagnoses:  Acute low back pain without sciatica, unspecified back pain laterality     Rx / DC Orders   ED Discharge Orders          Ordered    meloxicam (MOBIC) 15 MG tablet  Daily        08/20/21 1617             Note:  This document was prepared using Dragon voice recognition software and may include unintentional dictation errors.   Vallarie Mare Owasa, PA-C 08/20/21  1626    Merlyn Lot, MD 08/20/21 Johnnye Lana

## 2021-08-20 NOTE — ED Notes (Signed)
S/p toradol im pt pale, diaphoretic, dizzy. Pt placed in trendelenburg, BP 99/60, hr 43, sats 97% ra. Edp to bedside. Pt started to recover after appro. 5 min. At this time pt states that she is better but not the same as before to shot. Bp 99/61, hr 66, sats 96% ra

## 2021-08-20 NOTE — ED Triage Notes (Signed)
Pt comes with c/o lower back pain for 3 days. Pt denies any known injuries. Pt denies any trouble with urination.

## 2021-08-20 NOTE — ED Notes (Signed)
See triage note  presents with lower back pain  denies any recent injury   ambulates well

## 2021-10-23 ENCOUNTER — Ambulatory Visit
Admission: EM | Admit: 2021-10-23 | Discharge: 2021-10-23 | Disposition: A | Discharge status: Home / Self Care | Payer: 59 | Attending: Emergency Medicine | Admitting: Emergency Medicine

## 2021-10-23 DIAGNOSIS — N760 Acute vaginitis: Secondary | ICD-10-CM | POA: Diagnosis present

## 2021-10-23 DIAGNOSIS — B9689 Other specified bacterial agents as the cause of diseases classified elsewhere: Secondary | ICD-10-CM | POA: Diagnosis present

## 2021-10-23 DIAGNOSIS — B3731 Acute candidiasis of vulva and vagina: Secondary | ICD-10-CM | POA: Diagnosis present

## 2021-10-23 LAB — URINALYSIS, ROUTINE W REFLEX MICROSCOPIC
Bilirubin Urine: NEGATIVE
Glucose, UA: NEGATIVE mg/dL
Hgb urine dipstick: NEGATIVE
Ketones, ur: 40 mg/dL — AB
Leukocytes,Ua: NEGATIVE
Nitrite: NEGATIVE
Protein, ur: NEGATIVE mg/dL
Specific Gravity, Urine: 1.02 (ref 1.005–1.030)
pH: 7 (ref 5.0–8.0)

## 2021-10-23 LAB — WET PREP, GENITAL
Sperm: NONE SEEN
Trich, Wet Prep: NONE SEEN
WBC, Wet Prep HPF POC: >=10 — AB (ref <10)

## 2021-10-23 MED ORDER — FLUCONAZOLE 200 MG PO TABS: 200.0000 mg | ORAL_TABLET | Freq: Every day | ORAL | 1 refills | Status: AC

## 2021-10-23 MED ORDER — METRONIDAZOLE 500 MG PO TABS: 500.0000 mg | ORAL_TABLET | Freq: Two times a day (BID) | ORAL | 0 refills | Status: DC

## 2021-10-23 NOTE — Discharge Instructions (Signed)
Take the Flagyl (metronidazole) 500 mg twice daily for treatment of your bacterial vaginosis.  Avoid alcohol while on the metronidazole as taken together will cause of vomiting.  Bacterial vaginosis is often caused by a imbalance of bacteria in your vaginal vault.  This is sometimes a result of using tampons or hormonal fluctuations during her menstrual cycle.  You if your symptoms are recurrent you can try using a boric acid suppository twice weekly to help maintain the acid-base balance in your vagina vault which could prevent further infection.  You can also try vaginal probiotics to help return normal bacterial balance.   For your vaginal yeast infection take 1 Diflucan now and repeat in 1 week if your symptoms persist.

## 2021-10-23 NOTE — ED Provider Notes (Signed)
[facility]    CSN: [id number] Arrival date & time: 10/23/21  1021      History   Chief Complaint Chief Complaint  Patient presents with   Vaginal Discharge    Entered by patient    HPI MARIALENA WOLLEN is a 26 y.o. female.   HPI  28 old female here for evaluation of GYN complaints.  Patient reports that for the last 5 days she has been experiencing a creamy white vaginal discharge with some had some yellow and vaginal itching.  She denies any fever, abdominal pain, urinary symptoms, nausea, or vomiting.  She has not been on any recent antibiotics.  Past Medical History:  Diagnosis Date   ADD (attention deficit disorder)    BV (bacterial vaginosis)    Recurrent   Obesity     Patient Active Problem List   Diagnosis Date Noted   Laceration of left index finger without damage to nail 08/25/2016   Need for prophylactic vaccination with combined diphtheria-tetanus-pertussis (DTP) vaccine 08/25/2016    Past Surgical History:  Procedure Laterality Date   NO PAST SURGERIES      OB History   No obstetric history on file.      Home Medications    Prior to Admission medications   Medication Sig Start Date End Date Taking? Authorizing Provider  fluconazole (DIFLUCAN) 200 MG tablet Take 1 tablet (200 mg total) by mouth daily for 2 doses. 10/23/21 10/25/21 Yes Becky Augusta, NP  metroNIDAZOLE (FLAGYL) 500 MG tablet Take 1 tablet (500 mg total) by mouth 2 (two) times daily. 10/23/21  Yes Becky Augusta, NP  ibuprofen (ADVIL) 600 MG tablet Take 1 tablet (600 mg total) by mouth every 6 (six) hours as needed. 05/17/21   Domenick Gong, MD  albuterol (PROAIR HFA) 108 (90 Base) MCG/ACT inhaler Inhale 1-2 puffs into the lungs every 6 (six) hours as needed for wheezing or shortness of breath. 10/22/19 03/22/20  Georgetta Haber, NP    Family History Family History  Problem Relation Age of Onset   Healthy Mother     Social History Social History   Tobacco Use   Smoking  status: Never   Smokeless tobacco: Never  Vaping Use   Vaping Use: Every day  Substance Use Topics   Alcohol use: No   Drug use: No     Allergies   Patient has no known allergies.   Review of Systems Review of Systems  Constitutional:  Negative for fever.  Gastrointestinal:  Negative for abdominal pain, nausea and vomiting.  Genitourinary:  Positive for vaginal discharge and vaginal pain. Negative for dysuria, frequency, hematuria and urgency.  Hematological: Negative.   Psychiatric/Behavioral: Negative.       Physical Exam Triage Vital Signs ED Triage Vitals  Enc Vitals Group     BP 10/23/21 1059 122/72     Pulse Rate 10/23/21 1059 78     Resp 10/23/21 1059 15     Temp 10/23/21 1059 98.7 F (37.1 C)     Temp Source 10/23/21 1059 Oral     SpO2 10/23/21 1059 99 %     Weight 10/23/21 1105 238 lb (108 kg)     Height 10/23/21 1105 5\' 6"  (1.676 m)     Head Circumference --      Peak Flow --      Pain Score 10/23/21 1105 0     Pain Loc --      Pain Edu? --  Excl. in [place]? --    No data found.  Updated Vital Signs BP 122/72 (BP Location: Left Arm)   Pulse 78   Temp 98.7 F (37.1 C) (Oral)   Resp 15   Ht 5\' 6"  (1.676 m)   Wt 238 lb (108 kg)   LMP 09/24/2021   SpO2 99%   BMI 38.41 kg/m   Visual Acuity Right Eye Distance:   Left Eye Distance:   Bilateral Distance:    Right Eye Near:   Left Eye Near:    Bilateral Near:     Physical Exam Vitals and nursing note reviewed.  Constitutional:      Appearance: Normal appearance. She is not ill-appearing.  HENT:     Head: Normocephalic and atraumatic.  Cardiovascular:     Rate and Rhythm: Normal rate and regular rhythm.     Pulses: Normal pulses.     Heart sounds: Normal heart sounds. No murmur heard.    No friction rub. No gallop.  Pulmonary:     Effort: Pulmonary effort is normal.     Breath sounds: Normal breath sounds. No wheezing, rhonchi or rales.  Abdominal:     General: Abdomen is flat.      Palpations: Abdomen is soft.     Tenderness: There is no abdominal tenderness. There is no right CVA tenderness, left CVA tenderness, guarding or rebound.  Skin:    General: Skin is warm and dry.     Capillary Refill: Capillary refill takes less than 2 seconds.  Neurological:     General: No focal deficit present.     Mental Status: She is alert and oriented to person, place, and time.  Psychiatric:        Mood and Affect: Mood normal.        Behavior: Behavior normal.        Thought Content: Thought content normal.        Judgment: Judgment normal.      UC Treatments / Results  Labs (all labs ordered are listed, but only abnormal results are displayed) Labs Reviewed  WET PREP, GENITAL - Abnormal; Notable for the following components:      Result Value   Yeast Wet Prep HPF POC PRESENT (*)    Clue Cells Wet Prep HPF POC PRESENT (*)    WBC, Wet Prep HPF POC >=10 (*)    All other components within normal limits  URINALYSIS, ROUTINE W REFLEX MICROSCOPIC - Abnormal; Notable for the following components:   Ketones, ur 40 (*)    All other components within normal limits    EKG   Radiology No results found.  Procedures Procedures (including critical care time)  Medications Ordered in UC Medications - No data to display  Initial Impression / Assessment and Plan / UC Course  I have reviewed the triage vital signs and the nursing notes.  Pertinent labs & imaging results that were available during my care of the patient were reviewed by me and considered in my medical decision making (see chart for details).  Patient is a pleasant, nontoxic-appearing 26 year old female here for evaluation of GYN complaints as outlined HPI above.  Her physical exam reveals a benign cardiopulmonary exam with S1-S2 heart sounds with regular rate and rhythm and lung sounds are clear auscultation all fields.  No CVA tenderness on exam.  Abdomen is soft, flat, nontender in all 4 quadrants.  Will check  urinalysis and wet prep.  Urinalysis shows 40 ketones but is otherwise unremarkable.  Vaginal wet prep is positive for yeast and clue cells but negative for trichomonas.  I will discharge patient with a diagnosis of bacterial vaginosis and vaginal yeast infection and treat her with metronidazole and Diflucan respectively.  Patient did have some questions about eczema and states that since she has been exercising she will develop flares.  She has seen dermatology in the past for this but has not seen dermatology at present.  I recommended that she follow-up with dermatology to discuss treatment options for her eczema.   Final Clinical Impressions(s) / UC Diagnoses   Final diagnoses:  BV (bacterial vaginosis)  Vaginal yeast infection     Discharge Instructions      Take the Flagyl (metronidazole) 500 mg twice daily for treatment of your bacterial vaginosis.  Avoid alcohol while on the metronidazole as taken together will cause of vomiting.  Bacterial vaginosis is often caused by a imbalance of bacteria in your vaginal vault.  This is sometimes a result of using tampons or hormonal fluctuations during her menstrual cycle.  You if your symptoms are recurrent you can try using a boric acid suppository twice weekly to help maintain the acid-base balance in your vagina vault which could prevent further infection.  You can also try vaginal probiotics to help return normal bacterial balance.   For your vaginal yeast infection take 1 Diflucan now and repeat in 1 week if your symptoms persist.     ED Prescriptions     Medication Sig Dispense Auth. Provider   fluconazole (DIFLUCAN) 200 MG tablet Take 1 tablet (200 mg total) by mouth daily for 2 doses. 2 tablet Becky Augusta, NP   metroNIDAZOLE (FLAGYL) 500 MG tablet Take 1 tablet (500 mg total) by mouth 2 (two) times daily. 14 tablet Becky Augusta, NP      PDMP not reviewed this encounter.   Becky Augusta, NP 10/23/21 1135

## 2021-10-23 NOTE — ED Triage Notes (Signed)
Patient to Urgent Care with reports of vaginal itching and discharge since last Thursday. Patient reports having bad eczema which worsens when she waxes/ shaves. States that she recently waxed her bikini line. Has been applying vaseline to soothe the area. Patient reports she is also having white vaginal discharge, has been taking azo yeast which has helped the itching but still having discharge.

## 2021-12-07 ENCOUNTER — Ambulatory Visit
Admission: EM | Admit: 2021-12-07 | Discharge: 2021-12-07 | Disposition: A | Discharge status: Home / Self Care | Payer: 59 | Attending: Emergency Medicine | Admitting: Emergency Medicine

## 2021-12-07 DIAGNOSIS — N760 Acute vaginitis: Secondary | ICD-10-CM

## 2021-12-07 DIAGNOSIS — B9689 Other specified bacterial agents as the cause of diseases classified elsewhere: Secondary | ICD-10-CM

## 2021-12-07 LAB — WET PREP, GENITAL
Sperm: NONE SEEN
Trich, Wet Prep: NONE SEEN
WBC, Wet Prep HPF POC: <10 (ref <10)
Yeast Wet Prep HPF POC: NONE SEEN

## 2021-12-07 MED ORDER — MICONAZOLE NITRATE 2 % EX CREA: 1.0000 | TOPICAL_CREAM | Freq: Two times a day (BID) | CUTANEOUS | 0 refills | Status: AC

## 2021-12-07 MED ORDER — METRONIDAZOLE 500 MG PO TABS: 500.0000 mg | ORAL_TABLET | Freq: Two times a day (BID) | ORAL | 0 refills | Status: AC

## 2021-12-07 NOTE — ED Provider Notes (Signed)
HPI  SUBJECTIVE:  Kristen Charles is a 26 y.o. female who presents with persistent vaginal irritation, redness for several weeks.  She states that she wore her sister's underwear that was several sizes too small, and 2 to 3 days later, she started having vaginal itching, thick, white, creamy discharge and some dysuria.  No vaginal burning, odor, rash, labial swelling.  No back, abdominal, pelvic pain.  No other urinary complaints.  She is in a long-term monogamous relationship with a female, who is asymptomatic.  STDs are not a concern today.  She tried Azo yeast and a dose of Diflucan which resolved the itching and discharge.  No aggravating factors.  She has a past medical history of BV and yeast.  No history of STDs, diabetes.  LMP: Last week.  Denies possibility of being pregnant.  PCP: [facility].    Past Medical History:  Diagnosis Date   ADD (attention deficit disorder)    BV (bacterial vaginosis)    Recurrent   Obesity     Past Surgical History:  Procedure Laterality Date   NO PAST SURGERIES      Family History  Problem Relation Age of Onset   Healthy Mother     Social History   Tobacco Use   Smoking status: Never   Smokeless tobacco: Never  Vaping Use   Vaping Use: Every day  Substance Use Topics   Alcohol use: No   Drug use: No    No current facility-administered medications for this encounter.  Current Outpatient Medications:    metroNIDAZOLE (FLAGYL) 500 MG tablet, Take 1 tablet (500 mg total) by mouth 2 (two) times daily for 7 days., Disp: 14 tablet, Rfl: 0   miconazole (MICOTIN) 2 % cream, Apply 1 Application topically 2 (two) times daily., Disp: 28.35 g, Rfl: 0  No Known Allergies   ROS  As noted in HPI.   Physical Exam  BP 120/71 (BP Location: Left Arm)   Pulse 73   Temp 98.4 F (36.9 C) (Oral)   Ht 5\' 6"  (1.676 m)   Wt 108 kg   LMP 11/30/2021 (Exact Date)   SpO2 98%   BMI 38.41 kg/m   Constitutional: Well developed, well  nourished, no acute distress Eyes:  EOMI, conjunctiva normal bilaterally HENT: Normocephalic, atraumatic,mucus membranes moist Respiratory: Normal inspiratory effort Cardiovascular: Normal rate GI: nondistended soft, nontender. No suprapubic tenderness  GU: External genitalia normal.  Normal external, inner labia.  Introitus normal color.  No ulcers, rash noted.    Normal os. Thin nonoderous  whitevaginal discharge. Chaperone present during exam skin: No rash, skin intact Musculoskeletal: no deformities Neurologic: Alert & oriented x 3, no focal neuro deficits Psychiatric: Speech and behavior appropriate   ED Course   Medications - No data to display  Orders Placed This Encounter  Procedures   Pelvic exam    Standing Status:   Standing    Number of Occurrences:   1   Wet prep, genital    Standing Status:   Standing    Number of Occurrences:   1    Results for orders placed or performed during the hospital encounter of 12/07/21 (from the past 24 hour(s))  Wet prep, genital     Status: Abnormal   Collection Time: 12/07/21  7:27 PM   Specimen: Vaginal  Result Value Ref Range   Yeast Wet Prep HPF POC NONE SEEN NONE SEEN   Trich, Wet Prep NONE SEEN NONE SEEN   Clue Cells  Wet Prep HPF POC PRESENT (A) NONE SEEN   WBC, Wet Prep HPF POC <10 <10   Sperm NONE SEEN    No results found.  ED Clinical Impression  1. BV (bacterial vaginosis)   2. Vaginitis and vulvovaginitis      ED Assessment/Plan     Patient has BV on wet prep.  Home with Flagyl.  However, will also send home with miconazole cream for her to apply to the introitus and labia in case she has residual yeast vaginitis.    No intercourse until symptoms have resolved.  Follow-up with PCP as needed. Discussed labs, MDM, plan and followup with patient. Pt agrees with plan.   Meds ordered this encounter  Medications   miconazole (MICOTIN) 2 % cream    Sig: Apply 1 Application topically 2 (two) times daily.     Dispense:  28.35 g    Refill:  0   metroNIDAZOLE (FLAGYL) 500 MG tablet    Sig: Take 1 tablet (500 mg total) by mouth 2 (two) times daily for 7 days.    Dispense:  14 tablet    Refill:  0    *This clinic note was created using Lobbyist. Therefore, there may be occasional mistakes despite careful proofreading.  ?     Melynda Ripple, MD 12/07/21 1954

## 2021-12-07 NOTE — ED Triage Notes (Signed)
Patient presents to UC due to redness and irration of the vagina.   Patient stated she stayed with her sister September 9th and didn't have a change of clothes. She wore her sisters underwear which are a few sizes too small.   Patient reports that she has some vaginal burning, itching -- which has cleared up but she is still having the redness.

## 2021-12-07 NOTE — Discharge Instructions (Addendum)
You have BV on your wet prep.  I am prescribing Flagyl, please finish this even if you feel better.  I am also prescribing miconazole cream for you to apply to the areas of irritation until the symptoms have resolved.  No intercourse until your symptoms have resolved.  Follow-up with your primary care provider as needed.

## 2021-12-19 ENCOUNTER — Ambulatory Visit
Admission: EM | Admit: 2021-12-19 | Discharge: 2021-12-19 | Disposition: A | Discharge status: Home / Self Care | Payer: 59 | Attending: Physician Assistant | Admitting: Physician Assistant

## 2021-12-19 DIAGNOSIS — M25512 Pain in left shoulder: Secondary | ICD-10-CM | POA: Diagnosis not present

## 2021-12-19 DIAGNOSIS — M5412 Radiculopathy, cervical region: Secondary | ICD-10-CM

## 2021-12-19 MED ORDER — BACLOFEN 10 MG PO TABS: 10.0000 mg | ORAL_TABLET | Freq: Three times a day (TID) | ORAL | 0 refills | Status: AC

## 2021-12-19 MED ORDER — PREDNISONE 10 MG PO TABS: ORAL_TABLET | ORAL | 0 refills | Status: AC

## 2021-12-19 MED ORDER — HYDROCODONE-ACETAMINOPHEN 5-325 MG PO TABS: 1.0000 | ORAL_TABLET | Freq: Four times a day (QID) | ORAL | 0 refills | Status: AC | PRN

## 2021-12-19 NOTE — ED Provider Notes (Signed)
[facility]    CSN: [id number] Arrival date & time: 12/19/21  1228      History   Chief Complaint Chief Complaint  Patient presents with   Shoulder Pain    Left     HPI Kristen Charles is a 26 y.o. female presenting for left-sided neck and shoulder pain over the past few days.  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  She reports this is a new job for her.  She says symptoms are constant and significant in her neck and left shoulder and posterior scapula.  When she flexes her neck forward she feels pain all the way down to her left wrist.  She has not had any numbness, tingling or weakness.  She also reports raising her left arm increases the pain.  She has taken 800 mg tablets of ibuprofen and says that only "knock the edge off."  She denies any real improvement in symptoms.  Has never had this pain before.  She is left-handed.  She has no other complaints.  HPI  Past Medical History:  Diagnosis Date   ADD (attention deficit disorder)    BV (bacterial vaginosis)    Recurrent   Obesity     Patient Active Problem List   Diagnosis Date Noted   Laceration of left index finger without damage to nail 08/25/2016   Need for prophylactic vaccination with combined diphtheria-tetanus-pertussis (DTP) vaccine 08/25/2016    Past Surgical History:  Procedure Laterality Date   NO PAST SURGERIES      OB History   No obstetric history on file.      Home Medications    Prior to Admission medications   Medication Sig Start Date End Date Taking? Authorizing Provider  HYDROcodone-acetaminophen (NORCO/VICODIN) 5-325 MG tablet Take 1 tablet by mouth every 6 (six) hours as needed for up to 3 days. 12/19/21 12/22/21 Yes Shirlee Latch, PA-C  predniSONE (DELTASONE) 10 MG tablet Take 6 tabs p.o. on day 1 and decrease by 1 tablet daily until complete 12/19/21  Yes Eusebio Friendly B, PA-C  miconazole (MICOTIN) 2 % cream Apply 1 Application  topically 2 (two) times daily. 12/07/21   Domenick Gong, MD  albuterol (PROAIR HFA) 108 (90 Base) MCG/ACT inhaler Inhale 1-2 puffs into the lungs every 6 (six) hours as needed for wheezing or shortness of breath. 10/22/19 03/22/20  Georgetta Haber, NP    Family History Family History  Problem Relation Age of Onset   Healthy Mother     Social History Social History   Tobacco Use   Smoking status: Never   Smokeless tobacco: Never  Vaping Use   Vaping Use: Every day  Substance Use Topics   Alcohol use: No   Drug use: No     Allergies   Patient has no known allergies.   Review of Systems Review of Systems  Musculoskeletal:  Positive for arthralgias, neck pain and neck stiffness. Negative for back pain, gait problem and joint swelling.  Neurological:  Negative for weakness and numbness.     Physical Exam Triage Vital Signs ED Triage Vitals  Enc Vitals Group     BP 12/19/21 1403 120/61     Pulse Rate 12/19/21 1403 67     Resp --      Temp 12/19/21 1403 98.3 F (36.8 C)     Temp Source 12/19/21 1403 Oral     SpO2 12/19/21 1403 100 %  Weight 12/19/21 1402 238 lb (108 kg)     Height 12/19/21 1402 5\' 6"  (1.676 m)     Head Circumference --      Peak Flow --      Pain Score 12/19/21 1402 6     Pain Loc --      Pain Edu? --      Excl. in [place]? --    No data found.  Updated Vital Signs BP 120/61 (BP Location: Right Arm)   Pulse 67   Temp 98.3 F (36.8 C) (Oral)   Ht 5\' 6"  (1.676 m)   Wt 238 lb (108 kg)   LMP 11/30/2021 (Exact Date)   SpO2 100%   BMI 38.41 kg/m    Physical Exam Vitals and nursing note reviewed.  Constitutional:      General: She is not in acute distress.    Appearance: Normal appearance. She is not ill-appearing or toxic-appearing.  HENT:     Head: Normocephalic and atraumatic.  Eyes:     General: No scleral icterus.       Right eye: No discharge.        Left eye: No discharge.     Conjunctiva/sclera: Conjunctivae normal.   Cardiovascular:     Rate and Rhythm: Normal rate and regular rhythm.     Heart sounds: Normal heart sounds.  Pulmonary:     Effort: Pulmonary effort is normal. No respiratory distress.     Breath sounds: Normal breath sounds.  Musculoskeletal:     Left shoulder: Tenderness (left posterior scapular and periscapular muscles as well as left trapezius) present. No swelling or deformity. Decreased range of motion (100 degrees flexion and abduction).     Cervical back: Neck supple. Tenderness (diffusely left paravertebral muscles) present. No swelling. Pain with movement present. Decreased range of motion (reduced flexion).     Comments: 5 out of 5 strength bilateral upper extremities.  Skin:    General: Skin is dry.  Neurological:     General: No focal deficit present.     Mental Status: She is alert. Mental status is at baseline.     Motor: No weakness.     Gait: Gait normal.  Psychiatric:        Mood and Affect: Mood normal.        Behavior: Behavior normal.        Thought Content: Thought content normal.      UC Treatments / Results  Labs (all labs ordered are listed, but only abnormal results are displayed) Labs Reviewed - No data to display  EKG   Radiology No results found.  Procedures Procedures (including critical care time)  Medications Ordered in UC Medications - No data to display  Initial Impression / Assessment and Plan / UC Course  I have reviewed the triage vital signs and the nursing notes.  Pertinent labs & imaging results that were available during my care of the patient were reviewed by me and considered in my medical decision making (see chart for details).   25 year old female presents for left-sided neck pain and shoulder pain for the past 3 days.  Reports that she has been lifting heavy boxes of candy and throwing them at work.  Reports symptoms started and have progressively worsened.  Increased pain with forward flexion of neck which since pain all  the way down to her wrist.  Increased pain with any raising of her left shoulder.  On examination she has reduced flexion of the neck due  to pain and discomfort.  Also reduced range of motion of shoulder to about 100 degrees flexion abduction.  Tenderness palpation of the left paravertebral muscles of neck, left trapezius and left periscapular and posterior scapular muscles.  Suspect patient is having possible pinched nerve/disc herniation and muscle spasm/strain.  We will treat at this time with prednisone taper and Norco as needed for severe breakthrough pain.  Also advised heat, ice, topical lidocaine patches or gels.  Advised following up with orthopedics if no improvement in the next couple weeks or symptoms worsen.  Work note provided.   Final Clinical Impressions(s) / UC Diagnoses   Final diagnoses:  Cervical radiculopathy  Acute pain of left shoulder     Discharge Instructions      NECK PAIN: Stressed avoiding painful activities. This can exacerbate your symptoms and make them worse.  May apply heat to the areas of pain for some relief. Use medications as directed. Be aware of which medications make you drowsy and do not drive or operate any kind of heavy machinery while using the medication (ie pain medications or muscle relaxers). F/U with PCP for reexamination or return sooner if condition worsens or does not begin to improve over the next few days.   NECK PAIN RED FLAGS: If symptoms get worse than they are right now, you should come back sooner for re-evaluation. If you have increased numbness/ tingling or notice that the numbness/tingling is affecting the legs or saddle region, go to ER. If you ever lose continence go to ER.         ED Prescriptions     Medication Sig Dispense Auth. Provider   predniSONE (DELTASONE) 10 MG tablet Take 6 tabs p.o. on day 1 and decrease by 1 tablet daily until complete 21 tablet Laurene Footman B, PA-C   HYDROcodone-acetaminophen (NORCO/VICODIN)  5-325 MG tablet Take 1 tablet by mouth every 6 (six) hours as needed for up to 3 days. 9 tablet Danton Clap, PA-C      I have reviewed the PDMP during this encounter.   Danton Clap, PA-C 12/19/21 1454

## 2021-12-19 NOTE — ED Triage Notes (Signed)
Patient reports that she started a new position at work and is having to do a lot of lifting above her head. Patient  reports that 3 days ago she started feeling a pulling on her left shoulder.

## 2021-12-19 NOTE — Discharge Instructions (Signed)
NECK PAIN: Stressed avoiding painful activities. This can exacerbate your symptoms and make them worse.  May apply heat to the areas of pain for some relief. Use medications as directed. Be aware of which medications make you drowsy and do not drive or operate any kind of heavy machinery while using the medication (ie pain medications or muscle relaxers). F/U with PCP for reexamination or return sooner if condition worsens or does not begin to improve over the next few days.   NECK PAIN RED FLAGS: If symptoms get worse than they are right now, you should come back sooner for re-evaluation. If you have increased numbness/ tingling or notice that the numbness/tingling is affecting the legs or saddle region, go to ER. If you ever lose continence go to ER.     

## 2022-06-09 ENCOUNTER — Encounter: Payer: Self-pay | Admitting: Emergency Medicine

## 2022-06-09 ENCOUNTER — Ambulatory Visit
Admission: EM | Admit: 2022-06-09 | Discharge: 2022-06-09 | Disposition: A | Discharge status: Home / Self Care | Payer: Worker's Compensation | Attending: Nurse Practitioner | Admitting: Nurse Practitioner

## 2022-06-09 DIAGNOSIS — T23272A Burn of second degree of left wrist, initial encounter: Secondary | ICD-10-CM

## 2022-06-09 MED ORDER — SILVER SULFADIAZINE 1 % EX CREA: 1.0000 | TOPICAL_CREAM | Freq: Every day | CUTANEOUS | 0 refills | Status: AC

## 2022-06-09 MED ORDER — SILVER SULFADIAZINE 1 % EX CREA
TOPICAL_CREAM | Freq: Once | CUTANEOUS | Status: AC
  Administered 2022-06-09: 1 via TOPICAL

## 2022-06-09 NOTE — Discharge Instructions (Signed)
Burn clean and dry.  Apply Silvadene daily and change dressing daily unless it becomes saturated Keep covered while at work Please follow-up with occupational medicine clinic for further evaluation and treatment as needed Please go to the ER for any worsening symptoms

## 2022-06-09 NOTE — ED Triage Notes (Signed)
Patient states that she burnt her left wrist on 06/08/22 on a hot rod that heats the food up.  Patient states that she injured it while at work.  Patient states that she has reported the injury to her manage but does not have the worker's comp paperwork with her today.

## 2022-06-09 NOTE — ED Provider Notes (Signed)
[facility]    CSN: [id number] Arrival date & time: 06/09/22  1406      History   Chief Complaint Chief Complaint  Patient presents with   Burn   Worker's Comp Injury    HPI Kristen Charles is a 27 y.o. female presents for evaluation of a burn.  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  She states it did blister.  She used a burn spray at work but nothing additional since that time.  She does state the blister has popped.  Does endorse pain at the area but denies swelling, drainage, fevers, chills.  No history of MRSA.  She states she is up-to-date on her tetanus.  No other concerns at this time.   Burn   Past Medical History:  Diagnosis Date   ADD (attention deficit disorder)    BV (bacterial vaginosis)    Recurrent   Obesity     Patient Active Problem List   Diagnosis Date Noted   Laceration of left index finger without damage to nail 08/25/2016   Need for prophylactic vaccination with combined diphtheria-tetanus-pertussis (DTP) vaccine 08/25/2016    Past Surgical History:  Procedure Laterality Date   NO PAST SURGERIES      OB History   No obstetric history on file.      Home Medications    Prior to Admission medications   Medication Sig Start Date End Date Taking? Authorizing Provider  silver sulfADIAZINE (SILVADENE) 1 % cream Apply 1 Application topically daily. 06/09/22  Yes Melynda Ripple, NP  baclofen (LIORESAL) 10 MG tablet Take 1 tablet (10 mg total) by mouth 3 (three) times daily. 12/19/21   Danton Clap, PA-C  miconazole (MICOTIN) 2 % cream Apply 1 Application topically 2 (two) times daily. 12/07/21   Melynda Ripple, MD  predniSONE (DELTASONE) 10 MG tablet Take 6 tabs p.o. on day 1 and decrease by 1 tablet daily until complete 12/19/21   Danton Clap, PA-C  albuterol [facility] HFA) 108 (90  Base) MCG/ACT inhaler Inhale 1-2 puffs into the lungs every 6 (six) hours as needed for wheezing or shortness of breath. 10/22/19 03/22/20  Zigmund Gottron, NP    Family History Family History  Problem Relation Age of Onset   Healthy Mother     Social History Social History   Tobacco Use   Smoking status: Never   Smokeless tobacco: Never  Vaping Use   Vaping Use: Every day  Substance Use Topics   Alcohol use: No   Drug use: No     Allergies   Patient has no known allergies.   Review of Systems Review of Systems  Skin:        Burn to left wrist     Physical Exam Triage Vital Signs ED Triage Vitals  Enc Vitals Group     BP 06/09/22 1427 115/82     Pulse Rate 06/09/22 1427 63     Resp 06/09/22 1427 14     Temp 06/09/22 1427 98.3 F (36.8 C)     Temp Source 06/09/22 1427 Oral     SpO2 06/09/22 1427 100 %     Weight 06/09/22 1425 238 lb 1.6 oz (108 kg)     Height 06/09/22 1425 5\' 6"  (1.676 m)     Head Circumference --  Peak Flow --      Pain Score 06/09/22 1424 8     Pain Loc --      Pain Edu? --      Excl. in [place]? --    No data found.  Updated Vital Signs BP 115/82 (BP Location: Right Arm)   Pulse 63   Temp 98.3 F (36.8 C) (Oral)   Resp 14   Ht 5\' 6"  (1.676 m)   Wt 238 lb 1.6 oz (108 kg)   LMP 05/14/2022 (Approximate)   SpO2 100%   BMI 38.43 kg/m   Visual Acuity Right Eye Distance:   Left Eye Distance:   Bilateral Distance:    Right Eye Near:   Left Eye Near:    Bilateral Near:     Physical Exam Vitals and nursing note reviewed.  Constitutional:      Appearance: Normal appearance.  HENT:     Head: Normocephalic and atraumatic.  Eyes:     Pupils: Pupils are equal, round, and reactive to light.  Cardiovascular:     Rate and Rhythm: Normal rate.  Pulmonary:     Effort: Pulmonary effort is normal.  Musculoskeletal:     Comments: 2cmx 1,5cm degree burn to the dorsum of the left wrist.  Mild erythema and area is tender to palpation.   No swelling or drainage.  Skin:    General: Skin is warm and dry.  Neurological:     General: No focal deficit present.     Mental Status: She is alert and oriented to person, place, and time.  Psychiatric:        Mood and Affect: Mood normal.        Behavior: Behavior normal.      UC Treatments / Results  Labs (all labs ordered are listed, but only abnormal results are displayed) Labs Reviewed - No data to display  EKG   Radiology No results found.  Procedures Procedures (including critical care time)  Medications Ordered in UC Medications  silver sulfADIAZINE (SILVADENE) 1 % cream (has no administration in time range)    Initial Impression / Assessment and Plan / UC Course  I have reviewed the triage vital signs and the nursing notes.  Pertinent labs & imaging results that were available during my care of the patient were reviewed by me and considered in my medical decision making (see chart for details).     Wound cleansed in clinic and dressed with Silvadene and nonadherent dressing Wound care reviewed Patient to follow-up with occupational medicine for further treatment and work restrictions ER precautions reviewed and patient verbalized understanding Final Clinical Impressions(s) / UC Diagnoses   Final diagnoses:  Partial thickness burn of left wrist, initial encounter   Discharge Instructions   None    ED Prescriptions     Medication Sig Dispense Auth. Provider   silver sulfADIAZINE (SILVADENE) 1 % cream Apply 1 Application topically daily. 50 g Melynda Ripple, NP      PDMP not reviewed this encounter.   Melynda Ripple, NP 06/09/22 [phone]

## 2023-03-17 ENCOUNTER — Ambulatory Visit
Admission: EM | Admit: 2023-03-17 | Discharge: 2023-03-17 | Disposition: A | Discharge status: Home / Self Care | Payer: BC Managed Care – PPO | Attending: Physician Assistant | Admitting: Physician Assistant

## 2023-03-17 ENCOUNTER — Ambulatory Visit (INDEPENDENT_AMBULATORY_CARE_PROVIDER_SITE_OTHER): Payer: BC Managed Care – PPO

## 2023-03-17 DIAGNOSIS — R0602 Shortness of breath: Secondary | ICD-10-CM | POA: Diagnosis present

## 2023-03-17 DIAGNOSIS — R051 Acute cough: Secondary | ICD-10-CM

## 2023-03-17 LAB — PREGNANCY, URINE: Preg Test, Ur: NEGATIVE

## 2023-03-17 MED ORDER — PROMETHAZINE-DM 6.25-15 MG/5ML PO SYRP: 5.0000 mL | ORAL_SOLUTION | Freq: Four times a day (QID) | ORAL | 0 refills | Status: AC | PRN

## 2023-03-17 NOTE — ED Provider Notes (Signed)
[facility]    CSN: [id number] Arrival date & time: 03/17/23  0920      History   Chief Complaint Chief Complaint  Patient presents with   Cough   Shortness of Breath    HPI Kristen Charles is a 27 y.o. female presenting for 8 day history of illness.  Reports productive cough with green mucus and fatigue. Over the past day she has developed shortness of breath and chest pain when taking a deep breath and coughing.  Denies fever, ear pain, sinus pain, sore throat.  Unsure of any sick contacts.  Has taken OTC meds without relief.  No history of lung disease.  Patient vapes.  No other complaints.  HPI  Past Medical History:  Diagnosis Date   ADD (attention deficit disorder)    BV (bacterial vaginosis)    Recurrent   Obesity     Patient Active Problem List   Diagnosis Date Noted   Laceration of left index finger without damage to nail 08/25/2016   Need for prophylactic vaccination with combined diphtheria-tetanus-pertussis (DTP) vaccine 08/25/2016    Past Surgical History:  Procedure Laterality Date   NO PAST SURGERIES      OB History   No obstetric history on file.      Home Medications    Prior to Admission medications   Medication Sig Start Date End Date Taking? Authorizing Provider  promethazine-dextromethorphan (PROMETHAZINE-DM) 6.25-15 MG/5ML syrup Take 5 mLs by mouth 4 (four) times daily as needed. 03/17/23  Yes Eusebio Friendly B, PA-C  baclofen (LIORESAL) 10 MG tablet Take 1 tablet (10 mg total) by mouth 3 (three) times daily. 12/19/21   Shirlee Latch, PA-C  miconazole (MICOTIN) 2 % cream Apply 1 Application topically 2 (two) times daily. 12/07/21   Domenick Gong, MD  predniSONE (DELTASONE) 10 MG tablet Take 6 tabs p.o. on day 1 and decrease by 1 tablet daily until complete 12/19/21   Eusebio Friendly B, PA-C  silver sulfADIAZINE (SILVADENE) 1 % cream Apply 1 Application topically daily. 06/09/22   Radford Pax, NP  albuterol (PROAIR HFA) 108  (90 Base) MCG/ACT inhaler Inhale 1-2 puffs into the lungs every 6 (six) hours as needed for wheezing or shortness of breath. 10/22/19 03/22/20  Georgetta Haber, NP    Family History Family History  Problem Relation Age of Onset   Healthy Mother     Social History Social History   Tobacco Use   Smoking status: Never   Smokeless tobacco: Never  Vaping Use   Vaping status: Every Day  Substance Use Topics   Alcohol use: No   Drug use: No     Allergies   Patient has no known allergies.   Review of Systems Review of Systems  Constitutional:  Positive for fatigue. Negative for chills, diaphoresis and fever.  HENT:  Positive for congestion and rhinorrhea. Negative for ear pain, sinus pressure, sinus pain and sore throat.   Respiratory:  Positive for cough and shortness of breath. Negative for wheezing.   Cardiovascular:  Positive for chest pain.  Gastrointestinal:  Negative for abdominal pain, nausea and vomiting.  Musculoskeletal:  Negative for arthralgias and myalgias.  Skin:  Negative for rash.  Neurological:  Negative for weakness and headaches.  Hematological:  Negative for adenopathy.     Physical Exam Triage Vital Signs ED Triage Vitals  Encounter Vitals Group     BP 03/17/23 1110 117/80     Systolic BP Percentile --  Diastolic BP Percentile --      Pulse Rate 03/17/23 1110 (!) 59     Resp 03/17/23 1110 18     Temp 03/17/23 1110 98.5 F (36.9 C)     Temp Source 03/17/23 1110 Oral     SpO2 03/17/23 1110 98 %     Weight --      Height --      Head Circumference --      Peak Flow --      Pain Score 03/17/23 1109 8     Pain Loc --      Pain Education --      Exclude from Growth Chart --    No data found.  Updated Vital Signs BP 117/80 (BP Location: Right Arm)   Pulse (!) 59   Temp 98.5 F (36.9 C) (Oral)   Resp 18   LMP 02/15/2023 (Approximate)   SpO2 98%     Physical Exam Vitals and nursing note reviewed.  Constitutional:      General: She is  not in acute distress.    Appearance: Normal appearance. She is not ill-appearing or toxic-appearing.  HENT:     Head: Normocephalic and atraumatic.     Nose: Congestion present.     Mouth/Throat:     Mouth: Mucous membranes are moist.     Pharynx: Oropharynx is clear.  Eyes:     General: No scleral icterus.       Right eye: No discharge.        Left eye: No discharge.     Conjunctiva/sclera: Conjunctivae normal.  Cardiovascular:     Rate and Rhythm: Regular rhythm. Bradycardia present.     Heart sounds: Normal heart sounds.  Pulmonary:     Effort: Pulmonary effort is normal. No respiratory distress.     Breath sounds: Normal breath sounds.  Musculoskeletal:     Cervical back: Neck supple.  Skin:    General: Skin is dry.  Neurological:     General: No focal deficit present.     Mental Status: She is alert. Mental status is at baseline.     Motor: No weakness.     Gait: Gait normal.  Psychiatric:        Mood and Affect: Mood normal.        Behavior: Behavior normal.      UC Treatments / Results  Labs (all labs ordered are listed, but only abnormal results are displayed) Labs Reviewed  PREGNANCY, URINE    EKG   Radiology No results found.  Procedures Procedures (including critical care time)  Medications Ordered in UC Medications - No data to display  Initial Impression / Assessment and Plan / UC Course  I have reviewed the triage vital signs and the nursing notes.  Pertinent labs & imaging results that were available during my care of the patient were reviewed by me and considered in my medical decision making (see chart for details).   27 year old female presents for 8 day history of productive cough and 1 day history of chest pain with coughing and breathing and shortness of breath.  Denies fever.  Patient is afebrile and overall well-appearing.  On exam she has mild nasal congestion.  Chest clear.  Chest x-ray obtained to assess for possible pneumonia.   Wet read negative.  Explained to patient her symptoms are likely consistent viral illness.  Supportive care encouraged with increased rest and fluids.  Sent Promethazine DM to pharmacy.  Advised to return if  she develops fever or any worsening symptoms.  Explained that chest colds can last for a few weeks sometimes.  Will contact patient via MyChart with official over read results.  Explained I will prescribe antibiotics if the radiologist determines she has pneumonia.  Reviewed return as needed.  Chest x-ray over read negative.  Result communicated to patient in MyChart.  Final Clinical Impressions(s) / UC Diagnoses   Final diagnoses:  Acute cough  Shortness of breath     Discharge Instructions      -Will contact you if chest x-ray shows any abnormality we did not discuss.  I will send antibiotics to the pharmacy for you if you have pneumonia. - I will send you a message with the results through MyChart. - I sent cough medicine to the pharmacy.  Increase rest and fluids. - You need to be seen again if you have a return of fever, worsening cough or increased breathing problem.     ED Prescriptions     Medication Sig Dispense Auth. Provider   promethazine-dextromethorphan (PROMETHAZINE-DM) 6.25-15 MG/5ML syrup Take 5 mLs by mouth 4 (four) times daily as needed. 118 mL Shirlee Latch, PA-C      PDMP not reviewed this encounter.   Shirlee Latch, PA-C 03/17/23 1252

## 2023-03-17 NOTE — ED Triage Notes (Signed)
Sx since 12-22  Productive cough with green mucus Sob  Chest hurts when she takes a deep breath and cough No fever No ear pain

## 2023-03-17 NOTE — Discharge Instructions (Signed)
-  Will contact you if chest x-ray shows any abnormality we did not discuss.  I will send antibiotics to the pharmacy for you if you have pneumonia. - I will send you a message with the results through MyChart. - I sent cough medicine to the pharmacy.  Increase rest and fluids. - You need to be seen again if you have a return of fever, worsening cough or increased breathing problem.

## 2023-05-09 IMAGING — CR DG ANKLE COMPLETE 3+V*L*
3 series · 3 of 3 positions shown · non-contrast
Comparison: None.

CLINICAL DATA: Pole left ankle pain twisting injury.

EXAM:
LEFT ANKLE COMPLETE - 3+ VIEW

[ankle ap]
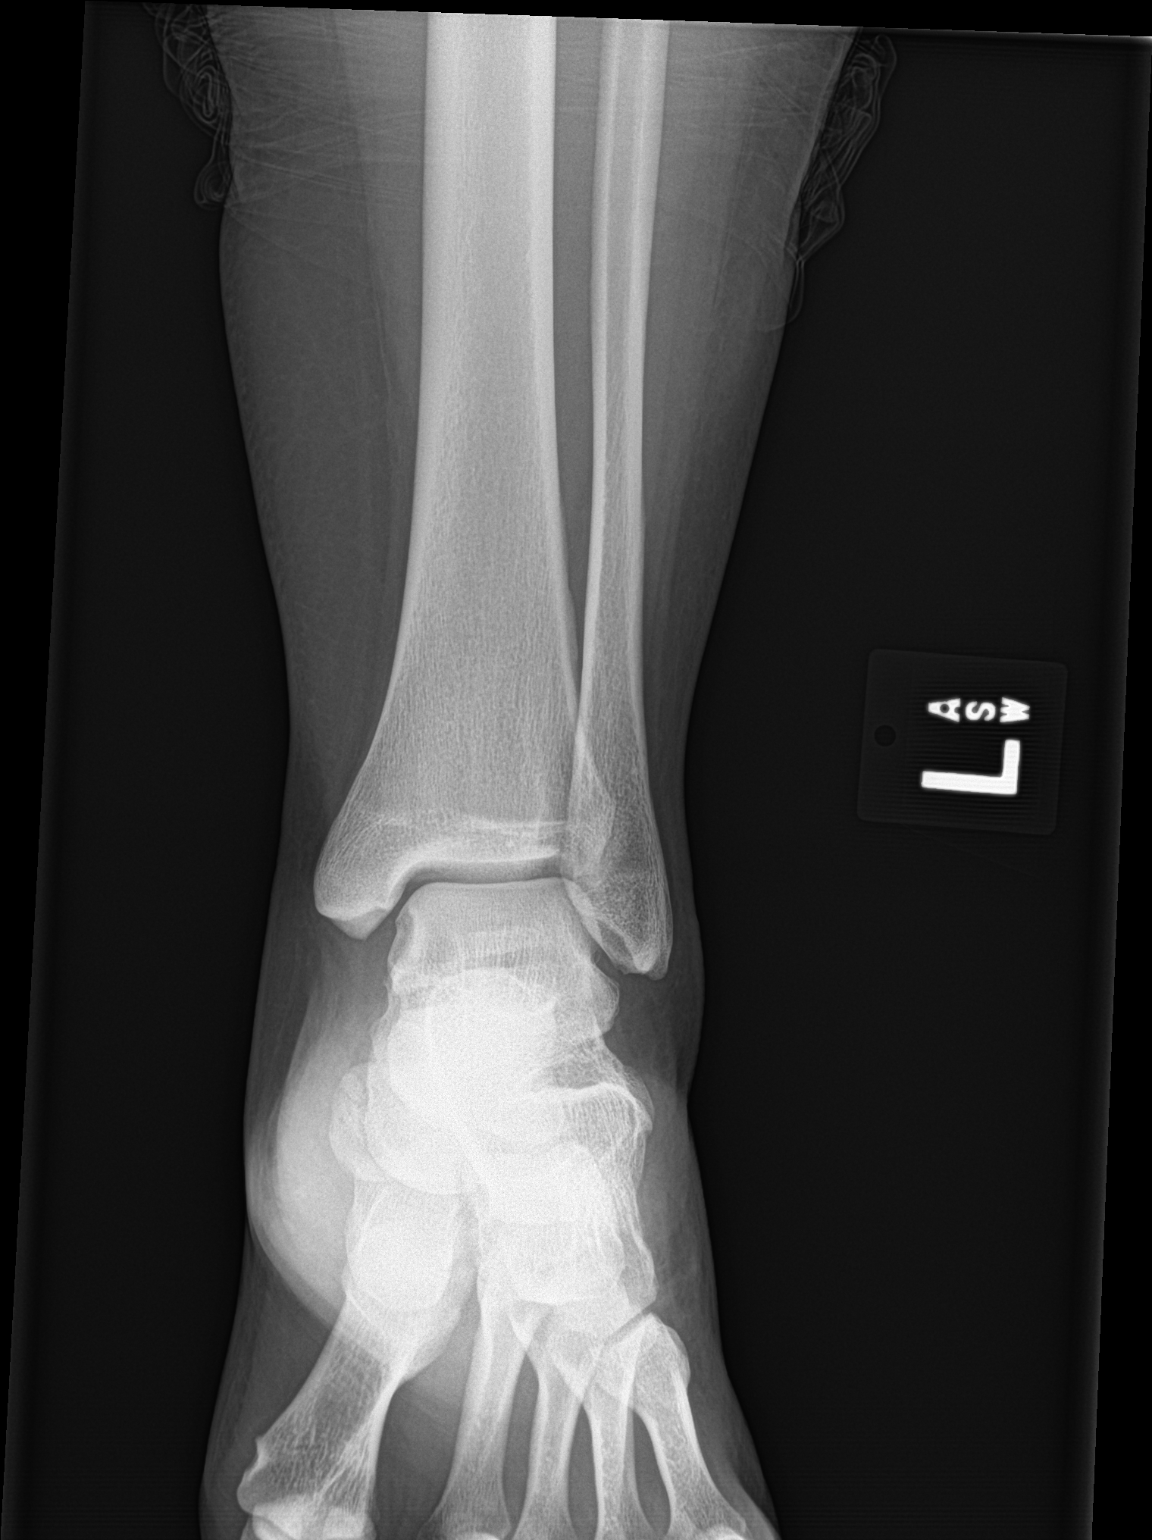

[ankle obl]
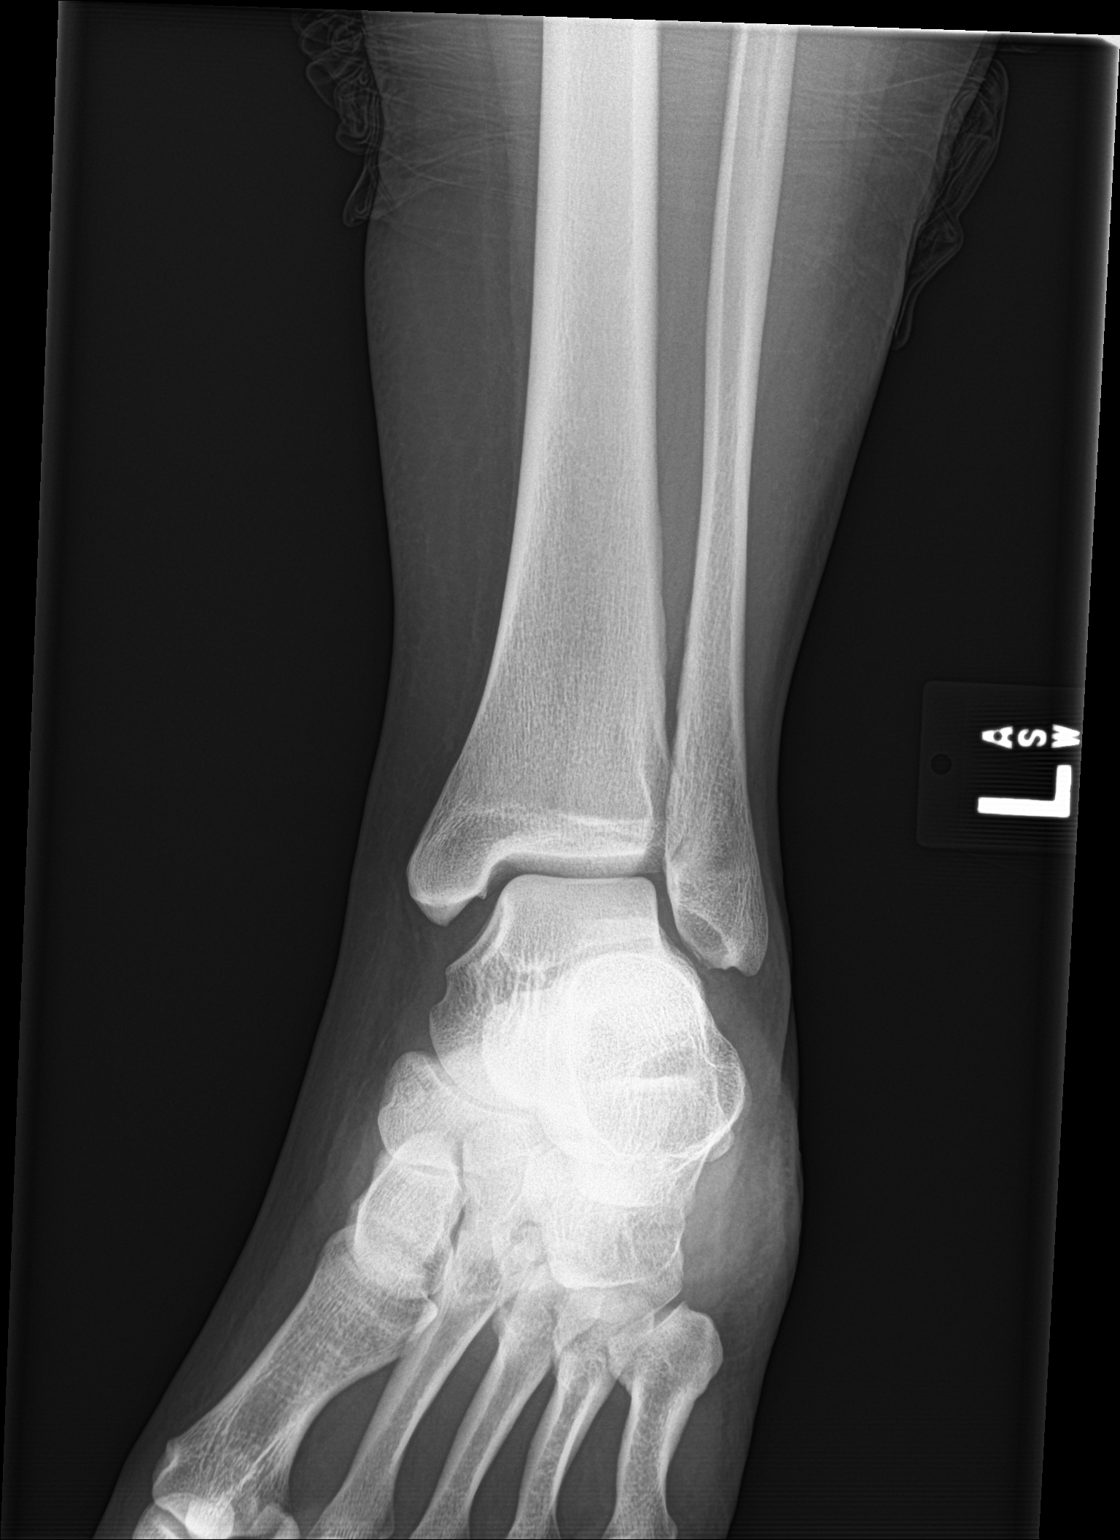

[ankle lat]
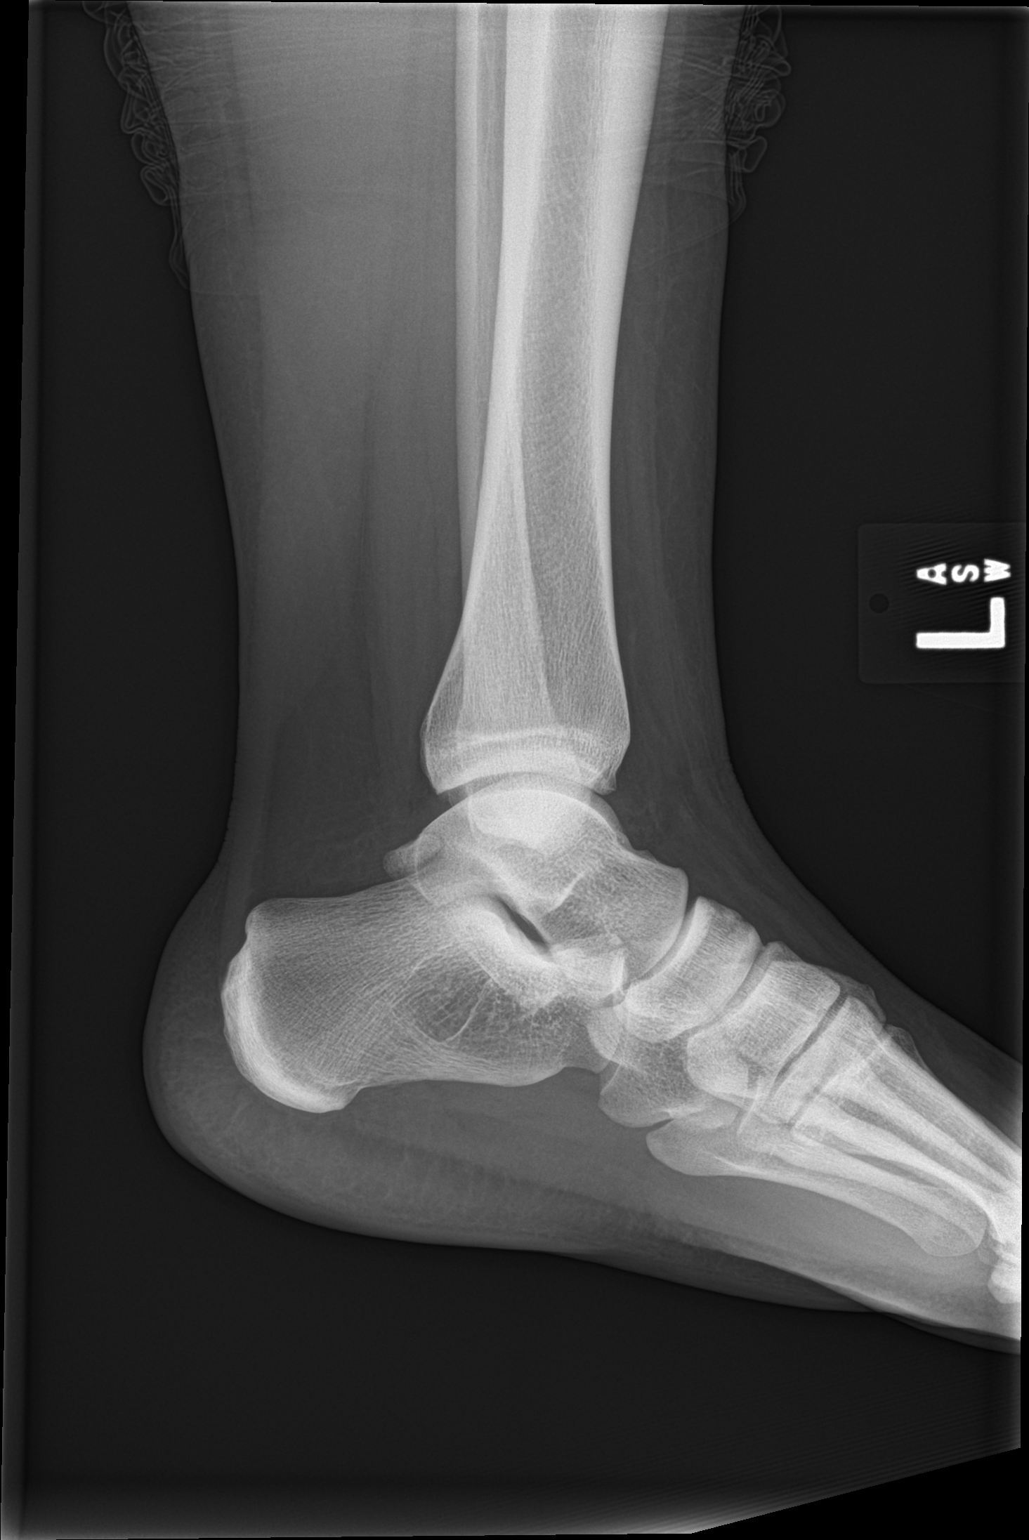

[3 of 3 positions shown; findings below may reference images not displayed]

FINDINGS: There is no evidence of fracture, dislocation, or joint effusion.
There is no evidence of arthropathy or other focal bone abnormality.
Soft tissues are unremarkable.
IMPRESSION: Negative.
